# Patient Record
Sex: Female | Born: 1950 | Race: Black or African American | Hispanic: No | Marital: Married | Smoking: Never smoker
Health system: Southern US, Community
[De-identification: ages and names within clinical notes are randomized; demographics above are authoritative.]

## PROBLEM LIST (undated history)

## (undated) DIAGNOSIS — J45909 Unspecified asthma, uncomplicated: Secondary | ICD-10-CM

## (undated) DIAGNOSIS — I499 Cardiac arrhythmia, unspecified: Secondary | ICD-10-CM

## (undated) DIAGNOSIS — I10 Essential (primary) hypertension: Secondary | ICD-10-CM

## (undated) DIAGNOSIS — K219 Gastro-esophageal reflux disease without esophagitis: Secondary | ICD-10-CM

## (undated) DIAGNOSIS — E119 Type 2 diabetes mellitus without complications: Secondary | ICD-10-CM

## (undated) DIAGNOSIS — I639 Cerebral infarction, unspecified: Secondary | ICD-10-CM

## (undated) DIAGNOSIS — M1990 Unspecified osteoarthritis, unspecified site: Secondary | ICD-10-CM

## (undated) DIAGNOSIS — I671 Cerebral aneurysm, nonruptured: Secondary | ICD-10-CM

## (undated) HISTORY — PX: ABDOMINAL HYSTERECTOMY: SHX81

## (undated) HISTORY — PX: RETINAL LASER PROCEDURE: SHX2339

## (undated) HISTORY — PX: CATARACT EXTRACTION W/ INTRAOCULAR LENS  IMPLANT, BILATERAL: SHX1307

---

## 1997-04-14 ENCOUNTER — Encounter: Admission: RE | Admit: 1997-04-14 | Discharge: 1997-07-13 | Payer: Self-pay | Admitting: *Deleted

## 1997-08-24 ENCOUNTER — Emergency Department (HOSPITAL_COMMUNITY): Admission: EM | Admit: 1997-08-24 | Discharge: 1997-08-24 | Payer: Self-pay | Admitting: Emergency Medicine

## 2013-05-11 ENCOUNTER — Other Ambulatory Visit: Payer: Self-pay | Admitting: Orthopedic Surgery

## 2013-05-21 ENCOUNTER — Encounter (HOSPITAL_COMMUNITY): Payer: Self-pay | Admitting: Pharmacy Technician

## 2013-05-22 NOTE — Pre-Procedure Instructions (Signed)
Anne ChapmanCora C Cox  05/22/2013   Your procedure is scheduled on:  May 18  [facility] at 08:00 AM.  Call this number if you have problems the morning of surgery: [phone]   Remember:   Do not eat food or drink liquids after midnight.   Take these medicines the morning of surgery with A SIP OF WATER: Amlodipine, Nexium, Metoprolol, Nasonex, Zyrtec   STOP Aspirin, Multiple Vitamins, Fish Oil today   STOP/ Do not take Aspirin, Aleve, Naproxen, Advil, Ibuprofen, Vitamin, Herbs, or Supplements starting today    Do not wear jewelry, make-up or nail polish.  Do not wear lotions, powders, or perfumes. You may wear deodorant.  Do not shave 48 hours prior to surgery. Men may shave face and neck.  Do not bring valuables to the hospital.  [facility] is not responsible for any belongings or valuables.               Contacts, dentures or bridgework may not be worn into surgery.  Leave suitcase in the car. After surgery it may be brought to your room.  For patients admitted to the hospital, discharge time is determined by your treatment team.               Special Instructions: See [facility] Preparing For [facility]   Please read over the following fact sheets that you were given: Pain Booklet, Coughing and Deep Breathing, Blood Transfusion Information, Total Joint Packet and Surgical Site Infection Prevention

## 2013-05-22 NOTE — Pre-Procedure Instructions (Signed)
[place] - Preparing for Surgery  Before surgery, you can play an important role.  Because skin is not sterile, your skin needs to be as free of germs as possible.  You can reduce the number of germs on you skin by washing with CHG (chlorahexidine gluconate) soap before surgery.  CHG is an antiseptic cleaner which kills germs and bonds with the skin to continue killing germs even after washing.  Please DO NOT use if you have an allergy to CHG or antibacterial soaps.  If your skin becomes reddened/irritated stop using the CHG and inform your nurse when you arrive at Short Stay.  Do not shave (including legs and underarms) for at least 48 hours prior to the first CHG shower.  You may shave your face.  Please follow these instructions carefully:   1.  Shower with CHG Soap the night before surgery and the morning of Surgery.  2.  If you choose to wash your hair, wash your hair first as usual with your normal shampoo.  3.  After you shampoo, rinse your hair and body thoroughly to remove the shampoo.  4.  Use CHG as you would any other liquid soap.  You can apply CHG directly to the skin and wash gently with scrungie or a clean washcloth.  5.  Apply the CHG Soap to your body ONLY FROM THE NECK DOWN.  Do not use on open wounds or open sores.  Avoid contact with your eyes, ears, mouth and genitals (private parts).  Wash genitals (private parts) with your normal soap.  6.  Wash thoroughly, paying special attention to the area where your surgery will be performed.  7.  Thoroughly rinse your body with warm water from the neck down.  8.  DO NOT shower/wash with your normal soap after using and rinsing off the CHG Soap.  9.  Pat yourself dry with a clean towel.            10.  Wear clean pajamas.            11.  Place clean sheets on your bed the night of your first shower and do not sleep with pets.  Day of Surgery  Do not apply any lotions the morning of surgery.  Please wear clean clothes to the  hospital/surgery center.   

## 2013-05-24 ENCOUNTER — Encounter (HOSPITAL_COMMUNITY)
Admission: RE | Admit: 2013-05-24 | Discharge: 2013-05-24 | Disposition: A | Discharge status: Home / Self Care | Payer: BC Managed Care – PPO | Source: Ambulatory Visit | Attending: Orthopedic Surgery | Admitting: Orthopedic Surgery

## 2013-05-24 ENCOUNTER — Ambulatory Visit (HOSPITAL_COMMUNITY)
Admission: RE | Admit: 2013-05-24 | Discharge: 2013-05-24 | Disposition: A | Discharge status: Home / Self Care | Payer: BC Managed Care – PPO | Source: Ambulatory Visit | Attending: Orthopedic Surgery | Admitting: Orthopedic Surgery

## 2013-05-24 ENCOUNTER — Encounter (HOSPITAL_COMMUNITY): Payer: Self-pay

## 2013-05-24 DIAGNOSIS — Z01818 Encounter for other preprocedural examination: Secondary | ICD-10-CM | POA: Insufficient documentation

## 2013-05-24 HISTORY — DX: Unspecified asthma, uncomplicated: J45.909

## 2013-05-24 HISTORY — DX: Unspecified osteoarthritis, unspecified site: M19.90

## 2013-05-24 HISTORY — DX: Cardiac arrhythmia, unspecified: I49.9

## 2013-05-24 HISTORY — DX: Essential (primary) hypertension: I10

## 2013-05-24 HISTORY — DX: Type 2 diabetes mellitus without complications: E11.9

## 2013-05-24 HISTORY — DX: Gastro-esophageal reflux disease without esophagitis: K21.9

## 2013-05-24 HISTORY — DX: Cerebral aneurysm, nonruptured: I67.1

## 2013-05-24 HISTORY — DX: Cerebral infarction, unspecified: I63.9

## 2013-05-24 LAB — CBC WITH DIFFERENTIAL/PLATELET
Basophils Absolute: 0 10*3/uL (ref 0.0–0.1)
Basophils Relative: 1 % (ref 0–1)
EOS PCT: 2 % (ref 0–5)
Eosinophils Absolute: 0.1 10*3/uL (ref 0.0–0.7)
HEMATOCRIT: 40.8 % (ref 36.0–46.0)
HEMOGLOBIN: 13 g/dL (ref 12.0–15.0)
LYMPHS ABS: 1.6 10*3/uL (ref 0.7–4.0)
Lymphocytes Relative: 27 % (ref 12–46)
MCH: 26.3 pg (ref 26.0–34.0)
MCHC: 31.9 g/dL (ref 30.0–36.0)
MCV: 82.4 fL (ref 78.0–100.0)
MONOS PCT: 5 % (ref 3–12)
Monocytes Absolute: 0.3 10*3/uL (ref 0.1–1.0)
Neutro Abs: 3.9 10*3/uL (ref 1.7–7.7)
Neutrophils Relative %: 65 % (ref 43–77)
Platelets: 257 10*3/uL (ref 150–400)
RBC: 4.95 MIL/uL (ref 3.87–5.11)
RDW: 14.9 % (ref 11.5–15.5)
WBC: 6 10*3/uL (ref 4.0–10.5)

## 2013-05-24 LAB — TYPE AND SCREEN
ABO/RH(D): A POS
ANTIBODY SCREEN: NEGATIVE

## 2013-05-24 LAB — URINALYSIS, ROUTINE W REFLEX MICROSCOPIC
Bilirubin Urine: NEGATIVE
Hgb urine dipstick: NEGATIVE
KETONES UR: NEGATIVE mg/dL
Nitrite: NEGATIVE
Protein, ur: NEGATIVE mg/dL
Specific Gravity, Urine: 1.04 — ABNORMAL HIGH (ref 1.005–1.030)
Urobilinogen, UA: 0.2 mg/dL (ref 0.0–1.0)
pH: 5 (ref 5.0–8.0)

## 2013-05-24 LAB — SURGICAL PCR SCREEN
MRSA, PCR: NEGATIVE
Staphylococcus aureus: NEGATIVE

## 2013-05-24 LAB — COMPREHENSIVE METABOLIC PANEL
ALT: 23 U/L (ref 0–35)
AST: 17 U/L (ref 0–37)
Albumin: 3.6 g/dL (ref 3.5–5.2)
Alkaline Phosphatase: 93 U/L (ref 39–117)
BUN: 12 mg/dL (ref 6–23)
CALCIUM: 9.2 mg/dL (ref 8.4–10.5)
CO2: 29 meq/L (ref 19–32)
Chloride: 101 mEq/L (ref 96–112)
Creatinine, Ser: 0.67 mg/dL (ref 0.50–1.10)
GFR calc non Af Amer: >90 mL/min (ref >90)
GLUCOSE: 195 mg/dL — AB (ref 70–99)
Potassium: 3.4 mEq/L — ABNORMAL LOW (ref 3.7–5.3)
SODIUM: 142 meq/L (ref 137–147)
Total Bilirubin: 0.4 mg/dL (ref 0.3–1.2)
Total Protein: 7.2 g/dL (ref 6.0–8.3)

## 2013-05-24 LAB — PROTIME-INR
INR: 0.91 (ref 0.00–1.49)
PROTHROMBIN TIME: 12.1 s (ref 11.6–15.2)

## 2013-05-24 LAB — APTT: aPTT: 46 seconds — ABNORMAL HIGH (ref 24–37)

## 2013-05-24 LAB — URINE MICROSCOPIC-ADD ON

## 2013-05-24 LAB — ABO/RH: ABO/RH(D): A POS

## 2013-05-24 NOTE — Progress Notes (Signed)
REQUESTED STRESS TEST, ECHO  FROM MEDICAL MD [phone].

## 2013-05-25 LAB — URINE CULTURE: Colony Count: >=100000

## 2013-05-25 NOTE — Progress Notes (Signed)
Anesthesia chart review: Patient is a 63 year old female scheduled for left TKA on 05/31/13 by Dr. Sherlean FootLucey.   History includes nonsmoker, hypertension, "irregular heartbeat" without mention of atrial fibrillation, diabetes mellitus type 2, asthma, GERD, arthritis, cerebral aneurysm with history of CVA '80's, hysterectomy. BMI is 37.9 consistent with obesity. PCP is Dr. Donata DuffMichael McLeod who cleared her for surgery following a non-ischemic stress test.  EKG on 05/24/13 showed NSR, possible LAE, LVH.   Nuclear stress test on 05/10/13 showed: No reversible perfusion defects on Lexis scan stress study. Calculated EF 65%.  Chest x-ray on 05/24/13 Center active cardiopulmonary disease.  Preoperative labs noted. Cr 0.67, glucose 195. CBC, PT/INR WNL. PTT elevated at 46. Urine culture showed greater than 100,000 colonies but multiple bacterial morphotypes present, none predominant. A1C was 7.5 in 02/2013 at Dr. Madalyn RobMcLeod's office. I'll repeat her PTT on arrival since it was elevated above 40.  If follow-up labs acceptable and otherwise no acute changes then I would anticipate that she can proceed as planned.  Velna Ochsllison Zelenak, PA-C [facility]/Anesthesiology Phone [phone] 05/25/2013 12:58 PM

## 2013-05-30 MED ORDER — CEFAZOLIN SODIUM-DEXTROSE 2-3 GM-% IV SOLR: 2.0000 g | INTRAVENOUS | Status: DC

## 2013-05-30 MED ORDER — BUPIVACAINE LIPOSOME 1.3 % IJ SUSP
20.0000 mL | Freq: Once | INTRAMUSCULAR | Status: DC
  Filled 2013-05-30: qty 20

## 2013-05-30 MED ORDER — TRANEXAMIC ACID 100 MG/ML IV SOLN
1000.0000 mg | INTRAVENOUS | Status: AC
  Administered 2013-05-31: 1000 mg via INTRAVENOUS
  Filled 2013-05-30: qty 10

## 2013-05-30 MED ORDER — CHLORHEXIDINE GLUCONATE 4 % EX LIQD
60.0000 mL | Freq: Once | CUTANEOUS | Status: DC
  Filled 2013-05-30: qty 60

## 2013-05-31 ENCOUNTER — Encounter (HOSPITAL_COMMUNITY)
Admission: RE | Disposition: A | Discharge status: Home / Self Care | Payer: Self-pay | Source: Ambulatory Visit | Attending: Orthopedic Surgery

## 2013-05-31 ENCOUNTER — Encounter (HOSPITAL_COMMUNITY): Payer: BC Managed Care – PPO | Admitting: Vascular Surgery

## 2013-05-31 ENCOUNTER — Inpatient Hospital Stay (HOSPITAL_COMMUNITY): Payer: BC Managed Care – PPO | Admitting: Anesthesiology

## 2013-05-31 ENCOUNTER — Inpatient Hospital Stay (HOSPITAL_COMMUNITY)
Admission: RE | Admit: 2013-05-31 | Discharge: 2013-06-02 | DRG: 470 | Disposition: A | Discharge status: Home / Self Care | Payer: BC Managed Care – PPO | Source: Ambulatory Visit | Attending: Orthopedic Surgery | Admitting: Orthopedic Surgery

## 2013-05-31 ENCOUNTER — Encounter (HOSPITAL_COMMUNITY): Payer: Self-pay

## 2013-05-31 DIAGNOSIS — J45909 Unspecified asthma, uncomplicated: Secondary | ICD-10-CM | POA: Diagnosis present

## 2013-05-31 DIAGNOSIS — E119 Type 2 diabetes mellitus without complications: Secondary | ICD-10-CM | POA: Diagnosis present

## 2013-05-31 DIAGNOSIS — M25569 Pain in unspecified knee: Secondary | ICD-10-CM | POA: Diagnosis present

## 2013-05-31 DIAGNOSIS — D62 Acute posthemorrhagic anemia: Secondary | ICD-10-CM | POA: Diagnosis not present

## 2013-05-31 DIAGNOSIS — Z96659 Presence of unspecified artificial knee joint: Secondary | ICD-10-CM

## 2013-05-31 DIAGNOSIS — I10 Essential (primary) hypertension: Secondary | ICD-10-CM | POA: Diagnosis present

## 2013-05-31 DIAGNOSIS — K219 Gastro-esophageal reflux disease without esophagitis: Secondary | ICD-10-CM | POA: Diagnosis present

## 2013-05-31 DIAGNOSIS — Z8673 Personal history of transient ischemic attack (TIA), and cerebral infarction without residual deficits: Secondary | ICD-10-CM

## 2013-05-31 DIAGNOSIS — M1710 Unilateral primary osteoarthritis, unspecified knee: Principal | ICD-10-CM | POA: Diagnosis present

## 2013-05-31 HISTORY — PX: TOTAL KNEE ARTHROPLASTY: SHX125

## 2013-05-31 LAB — CBC
HCT: 37.9 % (ref 36.0–46.0)
Hemoglobin: 12.2 g/dL (ref 12.0–15.0)
MCH: 26.5 pg (ref 26.0–34.0)
MCHC: 32.2 g/dL (ref 30.0–36.0)
MCV: 82.4 fL (ref 78.0–100.0)
PLATELETS: 198 10*3/uL (ref 150–400)
RBC: 4.6 MIL/uL (ref 3.87–5.11)
RDW: 15 % (ref 11.5–15.5)
WBC: 12.9 10*3/uL — AB (ref 4.0–10.5)

## 2013-05-31 LAB — HEMOGLOBIN A1C
Hgb A1c MFr Bld: 8.4 % — ABNORMAL HIGH (ref <5.7)
Mean Plasma Glucose: 194 mg/dL — ABNORMAL HIGH (ref <117)

## 2013-05-31 LAB — CREATININE, SERUM
Creatinine, Ser: 0.51 mg/dL (ref 0.50–1.10)
GFR calc non Af Amer: >90 mL/min (ref >90)

## 2013-05-31 LAB — GLUCOSE, CAPILLARY
GLUCOSE-CAPILLARY: 173 mg/dL — AB (ref 70–99)
Glucose-Capillary: 125 mg/dL — ABNORMAL HIGH (ref 70–99)
Glucose-Capillary: 215 mg/dL — ABNORMAL HIGH (ref 70–99)

## 2013-05-31 LAB — APTT: APTT: 42 s — AB (ref 24–37)

## 2013-05-31 SURGERY — ARTHROPLASTY, KNEE, TOTAL
Anesthesia: Regional | Laterality: Left

## 2013-05-31 MED ORDER — CANAGLIFLOZIN 300 MG PO TABS
300.0000 mg | ORAL_TABLET | Freq: Every day | ORAL | Status: DC
  Administered 2013-06-01 – 2013-06-02 (×2): 300 mg via ORAL
  Filled 2013-05-31 (×2): qty 1

## 2013-05-31 MED ORDER — FENTANYL CITRATE 0.05 MG/ML IJ SOLN
INTRAMUSCULAR | Status: DC | PRN
Start: 2013-05-31 — End: 2013-05-31
  Administered 2013-05-31: 50 ug via INTRAVENOUS
  Administered 2013-05-31 (×2): 100 ug via INTRAVENOUS
  Administered 2013-05-31: 50 ug via INTRAVENOUS

## 2013-05-31 MED ORDER — LACTATED RINGERS IV SOLN
INTRAVENOUS | Status: DC
  Administered 2013-05-31 (×2): via INTRAVENOUS

## 2013-05-31 MED ORDER — INSULIN DETEMIR 100 UNIT/ML FLEXPEN: 55.0000 [IU] | PEN_INJECTOR | Freq: Two times a day (BID) | SUBCUTANEOUS | Status: DC

## 2013-05-31 MED ORDER — SODIUM CHLORIDE 0.9 % IV SOLN: INTRAVENOUS | Status: DC

## 2013-05-31 MED ORDER — DOCUSATE SODIUM 100 MG PO CAPS
100.0000 mg | ORAL_CAPSULE | Freq: Two times a day (BID) | ORAL | Status: DC
  Administered 2013-05-31 – 2013-06-02 (×4): 100 mg via ORAL
  Filled 2013-05-31 (×5): qty 1

## 2013-05-31 MED ORDER — SUCCINYLCHOLINE CHLORIDE 20 MG/ML IJ SOLN
INTRAMUSCULAR | Status: DC | PRN
  Administered 2013-05-31: 120 mg via INTRAVENOUS

## 2013-05-31 MED ORDER — LORATADINE 10 MG PO TABS
10.0000 mg | ORAL_TABLET | Freq: Every day | ORAL | Status: DC
  Administered 2013-06-01 – 2013-06-02 (×2): 10 mg via ORAL
  Filled 2013-05-31 (×2): qty 1

## 2013-05-31 MED ORDER — PHENYLEPHRINE HCL 10 MG/ML IJ SOLN
INTRAMUSCULAR | Status: DC | PRN
  Administered 2013-05-31: 80 ug via INTRAVENOUS

## 2013-05-31 MED ORDER — FLEET ENEMA 7-19 GM/118ML RE ENEM: 1.0000 | ENEMA | Freq: Once | RECTAL | Status: AC | PRN

## 2013-05-31 MED ORDER — METOPROLOL SUCCINATE ER 50 MG PO TB24
50.0000 mg | ORAL_TABLET | Freq: Two times a day (BID) | ORAL | Status: DC
  Administered 2013-05-31 – 2013-06-02 (×4): 50 mg via ORAL
  Filled 2013-05-31 (×5): qty 1

## 2013-05-31 MED ORDER — SODIUM CHLORIDE 0.9 % IR SOLN
Status: DC | PRN
  Administered 2013-05-31: 2000 mL

## 2013-05-31 MED ORDER — HYDROMORPHONE HCL PF 1 MG/ML IJ SOLN: 1.0000 mg | INTRAMUSCULAR | Status: DC | PRN

## 2013-05-31 MED ORDER — LOSARTAN POTASSIUM-HCTZ 100-25 MG PO TABS: 1.0000 | ORAL_TABLET | Freq: Every day | ORAL | Status: DC

## 2013-05-31 MED ORDER — ATORVASTATIN CALCIUM 20 MG PO TABS
20.0000 mg | ORAL_TABLET | Freq: Every day | ORAL | Status: DC
  Administered 2013-05-31 – 2013-06-01 (×2): 20 mg via ORAL
  Filled 2013-05-31 (×3): qty 1

## 2013-05-31 MED ORDER — OXYCODONE HCL 5 MG PO TABS
5.0000 mg | ORAL_TABLET | ORAL | Status: DC | PRN
  Administered 2013-05-31 – 2013-06-02 (×5): 10 mg via ORAL
  Filled 2013-05-31 (×5): qty 2

## 2013-05-31 MED ORDER — FENTANYL CITRATE 0.05 MG/ML IJ SOLN
INTRAMUSCULAR | Status: AC
  Administered 2013-05-31: 100 ug
  Filled 2013-05-31: qty 2

## 2013-05-31 MED ORDER — ONDANSETRON HCL 4 MG/2ML IJ SOLN: 4.0000 mg | Freq: Four times a day (QID) | INTRAMUSCULAR | Status: DC | PRN

## 2013-05-31 MED ORDER — INSULIN DETEMIR 100 UNIT/ML ~~LOC~~ SOLN
55.0000 [IU] | Freq: Two times a day (BID) | SUBCUTANEOUS | Status: DC
  Administered 2013-05-31 – 2013-06-01 (×3): 55 [IU] via SUBCUTANEOUS
  Filled 2013-05-31 (×5): qty 0.55

## 2013-05-31 MED ORDER — METHOCARBAMOL 500 MG PO TABS
500.0000 mg | ORAL_TABLET | Freq: Four times a day (QID) | ORAL | Status: DC | PRN
  Administered 2013-06-01 – 2013-06-02 (×5): 500 mg via ORAL
  Filled 2013-05-31 (×5): qty 1

## 2013-05-31 MED ORDER — TAVABOROLE 5 % EX SOLN: 1.0000 "application " | Freq: Every day | CUTANEOUS | Status: DC

## 2013-05-31 MED ORDER — LOSARTAN POTASSIUM 50 MG PO TABS
100.0000 mg | ORAL_TABLET | Freq: Every day | ORAL | Status: DC
  Administered 2013-05-31 – 2013-06-02 (×3): 100 mg via ORAL
  Filled 2013-05-31 (×3): qty 2

## 2013-05-31 MED ORDER — LIDOCAINE HCL (CARDIAC) 10 MG/ML IV SOLN
INTRAVENOUS | Status: DC | PRN
  Administered 2013-05-31: 80 mg via INTRAVENOUS

## 2013-05-31 MED ORDER — BUPIVACAINE-EPINEPHRINE (PF) 0.5% -1:200000 IJ SOLN
INTRAMUSCULAR | Status: AC
  Filled 2013-05-31: qty 30

## 2013-05-31 MED ORDER — PROPOFOL 10 MG/ML IV BOLUS
INTRAVENOUS | Status: DC | PRN
  Administered 2013-05-31: 180 mg via INTRAVENOUS

## 2013-05-31 MED ORDER — WHITE PETROLATUM GEL
Status: AC
  Administered 2013-05-31: 0.2
  Filled 2013-05-31: qty 5

## 2013-05-31 MED ORDER — DIPHENHYDRAMINE HCL 12.5 MG/5ML PO ELIX: 12.5000 mg | ORAL_SOLUTION | ORAL | Status: DC | PRN

## 2013-05-31 MED ORDER — MIDAZOLAM HCL 2 MG/2ML IJ SOLN
INTRAMUSCULAR | Status: AC
  Administered 2013-05-31: 2 mg
  Filled 2013-05-31: qty 2

## 2013-05-31 MED ORDER — METHOCARBAMOL 1000 MG/10ML IJ SOLN
500.0000 mg | Freq: Four times a day (QID) | INTRAVENOUS | Status: DC | PRN
  Filled 2013-05-31: qty 5

## 2013-05-31 MED ORDER — BUPIVACAINE LIPOSOME 1.3 % IJ SUSP
INTRAMUSCULAR | Status: DC | PRN
  Administered 2013-05-31: 20 mL

## 2013-05-31 MED ORDER — SODIUM CHLORIDE 0.9 % IR SOLN
Status: DC | PRN
  Administered 2013-05-31: 1000 mL

## 2013-05-31 MED ORDER — PANTOPRAZOLE SODIUM 40 MG PO TBEC
80.0000 mg | DELAYED_RELEASE_TABLET | Freq: Every day | ORAL | Status: DC
  Administered 2013-06-02: 80 mg via ORAL
  Filled 2013-05-31: qty 2

## 2013-05-31 MED ORDER — ACETAMINOPHEN 325 MG PO TABS: 650.0000 mg | ORAL_TABLET | Freq: Four times a day (QID) | ORAL | Status: DC | PRN

## 2013-05-31 MED ORDER — HYDROMORPHONE HCL PF 1 MG/ML IJ SOLN: 0.2500 mg | INTRAMUSCULAR | Status: DC | PRN

## 2013-05-31 MED ORDER — MENTHOL 3 MG MT LOZG: 1.0000 | LOZENGE | OROMUCOSAL | Status: DC | PRN

## 2013-05-31 MED ORDER — ALUM & MAG HYDROXIDE-SIMETH 200-200-20 MG/5ML PO SUSP: 30.0000 mL | ORAL | Status: DC | PRN

## 2013-05-31 MED ORDER — HYDROCHLOROTHIAZIDE 25 MG PO TABS
25.0000 mg | ORAL_TABLET | Freq: Every day | ORAL | Status: DC
  Administered 2013-05-31 – 2013-06-02 (×3): 25 mg via ORAL
  Filled 2013-05-31 (×3): qty 1

## 2013-05-31 MED ORDER — CEFAZOLIN SODIUM 1-5 GM-% IV SOLN
1.0000 g | Freq: Three times a day (TID) | INTRAVENOUS | Status: DC
  Filled 2013-05-31: qty 50

## 2013-05-31 MED ORDER — PHENOL 1.4 % MT LIQD: 1.0000 | OROMUCOSAL | Status: DC | PRN

## 2013-05-31 MED ORDER — INSULIN ASPART 100 UNIT/ML ~~LOC~~ SOLN
0.0000 [IU] | Freq: Three times a day (TID) | SUBCUTANEOUS | Status: DC
  Administered 2013-05-31 – 2013-06-01 (×3): 3 [IU] via SUBCUTANEOUS
  Administered 2013-06-01: 2 [IU] via SUBCUTANEOUS

## 2013-05-31 MED ORDER — ENOXAPARIN SODIUM 30 MG/0.3ML ~~LOC~~ SOLN
30.0000 mg | Freq: Two times a day (BID) | SUBCUTANEOUS | Status: DC
  Administered 2013-06-01 – 2013-06-02 (×3): 30 mg via SUBCUTANEOUS
  Filled 2013-05-31 (×7): qty 0.3

## 2013-05-31 MED ORDER — PHENYLEPHRINE HCL 10 MG/ML IJ SOLN
10.0000 mg | INTRAVENOUS | Status: DC | PRN
  Administered 2013-05-31: 30 ug/min via INTRAVENOUS

## 2013-05-31 MED ORDER — AMLODIPINE BESYLATE 10 MG PO TABS
10.0000 mg | ORAL_TABLET | Freq: Every day | ORAL | Status: DC
  Administered 2013-06-01 – 2013-06-02 (×2): 10 mg via ORAL
  Filled 2013-05-31 (×2): qty 1

## 2013-05-31 MED ORDER — CEFAZOLIN SODIUM-DEXTROSE 2-3 GM-% IV SOLR
INTRAVENOUS | Status: AC
  Administered 2013-05-31: 3 g via INTRAVENOUS
  Filled 2013-05-31: qty 50

## 2013-05-31 MED ORDER — CELECOXIB 200 MG PO CAPS
200.0000 mg | ORAL_CAPSULE | Freq: Two times a day (BID) | ORAL | Status: DC
  Administered 2013-05-31 – 2013-06-02 (×4): 200 mg via ORAL
  Filled 2013-05-31 (×5): qty 1

## 2013-05-31 MED ORDER — METOCLOPRAMIDE HCL 5 MG/ML IJ SOLN: 5.0000 mg | Freq: Three times a day (TID) | INTRAMUSCULAR | Status: DC | PRN

## 2013-05-31 MED ORDER — BUPIVACAINE-EPINEPHRINE 0.5% -1:200000 IJ SOLN
INTRAMUSCULAR | Status: DC | PRN
  Administered 2013-05-31: 30 mL

## 2013-05-31 MED ORDER — OXYCODONE HCL 5 MG PO TABS: 5.0000 mg | ORAL_TABLET | Freq: Once | ORAL | Status: DC | PRN

## 2013-05-31 MED ORDER — SENNOSIDES-DOCUSATE SODIUM 8.6-50 MG PO TABS: 1.0000 | ORAL_TABLET | Freq: Every evening | ORAL | Status: DC | PRN

## 2013-05-31 MED ORDER — ACETAMINOPHEN 650 MG RE SUPP: 650.0000 mg | Freq: Four times a day (QID) | RECTAL | Status: DC | PRN

## 2013-05-31 MED ORDER — METOCLOPRAMIDE HCL 10 MG PO TABS: 5.0000 mg | ORAL_TABLET | Freq: Three times a day (TID) | ORAL | Status: DC | PRN

## 2013-05-31 MED ORDER — TERBINAFINE HCL 250 MG PO TABS
250.0000 mg | ORAL_TABLET | Freq: Every day | ORAL | Status: DC
  Administered 2013-05-31 – 2013-06-02 (×3): 250 mg via ORAL
  Filled 2013-05-31 (×3): qty 1

## 2013-05-31 MED ORDER — OXYCODONE HCL ER 10 MG PO T12A
10.0000 mg | EXTENDED_RELEASE_TABLET | Freq: Two times a day (BID) | ORAL | Status: DC
  Administered 2013-05-31 – 2013-06-02 (×3): 10 mg via ORAL
  Filled 2013-05-31 (×3): qty 1

## 2013-05-31 MED ORDER — CEFAZOLIN SODIUM-DEXTROSE 2-3 GM-% IV SOLR
2.0000 g | Freq: Four times a day (QID) | INTRAVENOUS | Status: AC
  Administered 2013-05-31 (×2): 2 g via INTRAVENOUS
  Filled 2013-05-31 (×2): qty 50

## 2013-05-31 MED ORDER — ONDANSETRON HCL 4 MG/2ML IJ SOLN
INTRAMUSCULAR | Status: DC | PRN
  Administered 2013-05-31: 4 mg via INTRAVENOUS

## 2013-05-31 MED ORDER — LIRAGLUTIDE 18 MG/3ML ~~LOC~~ SOPN
1.8000 [IU] | PEN_INJECTOR | Freq: Every morning | SUBCUTANEOUS | Status: DC
  Administered 2013-06-02: 1.8 [IU] via SUBCUTANEOUS

## 2013-05-31 MED ORDER — BISACODYL 5 MG PO TBEC: 5.0000 mg | DELAYED_RELEASE_TABLET | Freq: Every day | ORAL | Status: DC | PRN

## 2013-05-31 MED ORDER — SODIUM CHLORIDE 0.9 % IV SOLN
INTRAVENOUS | Status: DC
  Administered 2013-05-31: 75 mL/h via INTRAVENOUS
  Administered 2013-06-01: 06:00:00 via INTRAVENOUS

## 2013-05-31 MED ORDER — OXYCODONE HCL 5 MG/5ML PO SOLN: 5.0000 mg | Freq: Once | ORAL | Status: DC | PRN

## 2013-05-31 MED ORDER — ONDANSETRON HCL 4 MG PO TABS: 4.0000 mg | ORAL_TABLET | Freq: Four times a day (QID) | ORAL | Status: DC | PRN

## 2013-05-31 MED ORDER — BUPIVACAINE-EPINEPHRINE (PF) 0.5% -1:200000 IJ SOLN
INTRAMUSCULAR | Status: DC | PRN
  Administered 2013-05-31: 20 mL

## 2013-05-31 SURGICAL SUPPLY — 58 items
BANDAGE ELASTIC 6 VELCRO ST LF (GAUZE/BANDAGES/DRESSINGS) ×1 IMPLANT
BANDAGE ESMARK 6X9 LF (GAUZE/BANDAGES/DRESSINGS) ×1 IMPLANT
BLADE 10 SAFETY STRL DISP (BLADE) ×2 IMPLANT
BLADE SAGITTAL 13X1.27X60 (BLADE) ×2 IMPLANT
BLADE SAW SGTL 83.5X18.5 (BLADE) ×2 IMPLANT
BNDG CMPR 9X6 STRL LF SNTH (GAUZE/BANDAGES/DRESSINGS) ×1
BNDG ESMARK 6X9 LF (GAUZE/BANDAGES/DRESSINGS) ×2
BOWL SMART MIX CTS (DISPOSABLE) ×2 IMPLANT
CAP POR NKTM CP VIT E LN CER ×1 IMPLANT
CEMENT BONE SIMPLEX SPEEDSET (Cement) ×4 IMPLANT
COVER SURGICAL LIGHT HANDLE (MISCELLANEOUS) ×2 IMPLANT
CUFF TOURNIQUET SINGLE 34IN LL (TOURNIQUET CUFF) ×2 IMPLANT
DRAPE EXTREMITY T 121X128X90 (DRAPE) ×2 IMPLANT
DRAPE INCISE IOBAN 66X45 STRL (DRAPES) ×4 IMPLANT
DRAPE PROXIMA HALF (DRAPES) ×2 IMPLANT
DRAPE U-SHAPE 47X51 STRL (DRAPES) ×2 IMPLANT
DRSG ADAPTIC 3X8 NADH LF (GAUZE/BANDAGES/DRESSINGS) ×2 IMPLANT
DRSG PAD ABDOMINAL 8X10 ST (GAUZE/BANDAGES/DRESSINGS) ×2 IMPLANT
DURAPREP 26ML APPLICATOR (WOUND CARE) ×4 IMPLANT
ELECT REM PT RETURN 9FT ADLT (ELECTROSURGICAL) ×2
ELECTRODE REM PT RTRN 9FT ADLT (ELECTROSURGICAL) ×1 IMPLANT
EVACUATOR 1/8 PVC DRAIN (DRAIN) ×2 IMPLANT
GLOVE BIOGEL M 7.0 STRL (GLOVE) IMPLANT
GLOVE BIOGEL PI IND STRL 7.5 (GLOVE) IMPLANT
GLOVE BIOGEL PI IND STRL 8.5 (GLOVE) ×2 IMPLANT
GLOVE BIOGEL PI INDICATOR 7.5 (GLOVE)
GLOVE BIOGEL PI INDICATOR 8.5 (GLOVE) ×2
GLOVE SURG ORTHO 8.0 STRL STRW (GLOVE) ×4 IMPLANT
GOWN STRL REUS W/ TWL LRG LVL3 (GOWN DISPOSABLE) ×2 IMPLANT
GOWN STRL REUS W/ TWL XL LVL3 (GOWN DISPOSABLE) ×2 IMPLANT
GOWN STRL REUS W/TWL LRG LVL3 (GOWN DISPOSABLE) ×4
GOWN STRL REUS W/TWL XL LVL3 (GOWN DISPOSABLE) ×4
HANDPIECE INTERPULSE COAX TIP (DISPOSABLE) ×2
HOOD PEEL AWAY FACE SHEILD DIS (HOOD) ×8 IMPLANT
KIT BASIN OR (CUSTOM PROCEDURE TRAY) ×2 IMPLANT
KIT ROOM TURNOVER OR (KITS) ×2 IMPLANT
MANIFOLD NEPTUNE II (INSTRUMENTS) ×2 IMPLANT
NEEDLE 22X1 1/2 (OR ONLY) (NEEDLE) ×4 IMPLANT
NS IRRIG 1000ML POUR BTL (IV SOLUTION) ×2 IMPLANT
PACK TOTAL JOINT (CUSTOM PROCEDURE TRAY) ×2 IMPLANT
PAD ARMBOARD 7.5X6 YLW CONV (MISCELLANEOUS) ×4 IMPLANT
PADDING CAST COTTON 6X4 STRL (CAST SUPPLIES) ×2 IMPLANT
SET HNDPC FAN SPRY TIP SCT (DISPOSABLE) ×1 IMPLANT
SPONGE GAUZE 4X4 12PLY (GAUZE/BANDAGES/DRESSINGS) ×2 IMPLANT
SPONGE GAUZE 4X4 12PLY STER LF (GAUZE/BANDAGES/DRESSINGS) ×1 IMPLANT
STAPLER VISISTAT 35W (STAPLE) ×2 IMPLANT
STOCKINETTE IMPERVIOUS LG (DRAPES) ×1 IMPLANT
SUCTION FRAZIER TIP 10 FR DISP (SUCTIONS) ×2 IMPLANT
SUT BONE WAX W31G (SUTURE) ×2 IMPLANT
SUT VIC AB 0 CTB1 27 (SUTURE) ×4 IMPLANT
SUT VIC AB 1 CT1 27 (SUTURE) ×6
SUT VIC AB 1 CT1 27XBRD ANBCTR (SUTURE) ×2 IMPLANT
SUT VIC AB 2-0 CT1 27 (SUTURE) ×4
SUT VIC AB 2-0 CT1 TAPERPNT 27 (SUTURE) ×2 IMPLANT
SYR CONTROL 10ML LL (SYRINGE) ×4 IMPLANT
TOWEL OR 17X24 6PK STRL BLUE (TOWEL DISPOSABLE) ×2 IMPLANT
TOWEL OR 17X26 10 PK STRL BLUE (TOWEL DISPOSABLE) ×2 IMPLANT
TRAY FOLEY CATH 14FR (SET/KITS/TRAYS/PACK) ×2 IMPLANT

## 2013-05-31 NOTE — Anesthesia Preprocedure Evaluation (Addendum)
Anesthesia Evaluation  Patient identified by MRN, date of birth, ID band Patient awake    Reviewed: Allergy & Precautions, H&P , NPO status , Patient's Chart, lab work & pertinent test results  Airway Mallampati: II TM Distance: <3 FB Neck ROM: full    Dental  (+) Teeth Intact, Dental Advisory Given   Pulmonary asthma ,          Cardiovascular hypertension, + Peripheral Vascular Disease     Neuro/Psych CVA    GI/Hepatic GERD-  ,  Endo/Other  diabetes, Type obesity  Renal/GU      Musculoskeletal  (+) Arthritis -,   Abdominal   Peds  Hematology   Anesthesia Other Findings   Reproductive/Obstetrics                         Anesthesia Physical Anesthesia Plan  ASA: III  Anesthesia Plan: General and Regional   Post-op Pain Management: MAC Combined w/ Regional for Post-op pain   Induction: Intravenous  Airway Management Planned: Oral ETT  Additional Equipment:   Intra-op Plan:   Post-operative Plan: Extubation in OR  Informed Consent: I have reviewed the patients History and Physical, chart, labs and discussed the procedure including the risks, benefits and alternatives for the proposed anesthesia with the patient or authorized representative who has indicated his/her understanding and acceptance.     Plan Discussed with: CRNA, Anesthesiologist and Surgeon  Anesthesia Plan Comments:         Anesthesia Quick Evaluation

## 2013-05-31 NOTE — Anesthesia Postprocedure Evaluation (Signed)
Anesthesia Post Note  Patient: Anne ChapmanCora C Delucia  Procedure(s) Performed: Procedure(s) (LRB): TOTAL KNEE ARTHROPLASTY (Left)  Anesthesia type: General  Patient location: PACU  Post pain: Pain level controlled and Adequate analgesia  Post assessment: Post-op Vital signs reviewed, Patient's Cardiovascular Status Stable, Respiratory Function Stable, Patent Airway and Pain level controlled  Last Vitals:  Filed Vitals:   05/31/13 1245  BP: 174/79  Pulse: 92  Temp:   Resp: 27    Post vital signs: Reviewed and stable  Level of consciousness: awake, alert  and oriented  Complications: No apparent anesthesia complications

## 2013-05-31 NOTE — Progress Notes (Signed)
Physical Therapy Evaluation Patient Details Name: Anne Cox MRN: [id number] DOB: 07/20/1950 Today's Date: 05/31/2013   History of Present Illness  Patient is a 63 yo female admitted 05/31/13 now s/p Lt TKA.  Patient with h/o HTN, DM, and asthma.  Clinical Impression  Patient presents with problems listed below.  Will benefit from acute PT to maximize independence prior to discharge home with husband.    Follow Up Recommendations Home health PT (If patient progresses well with PT);Supervision/Assistance - 24 hour    Equipment Recommendations  None recommended by PT    Recommendations for Other Services       Precautions / Restrictions Precautions Precautions: Knee Precaution Booklet Issued: Yes (comment) Precaution Comments: Reviewed precautions with patient and her husband. Restrictions Weight Bearing Restrictions: Yes LLE Weight Bearing: Weight bearing as tolerated      Mobility  Bed Mobility Overal bed mobility: Needs Assistance Bed Mobility: Supine to Sit     Supine to sit: Min assist     General bed mobility comments: Instructed patient on donning KI on LLE.  Verbal cues for bed mobility.  Assist to move LLE off of bed.  Transfers Overall transfer level: Needs assistance Equipment used: Rolling walker (2 wheeled) Transfers: Sit to/from [employer] CorporationStand;Stand Pivot Transfers Sit to Stand: Mod assist Stand pivot transfers: Min assist       General transfer comment: Verbal cues for hand placement.  Assist to rise to standing and for balance.  Once upright, patient able to take several shuffle steps to pivot to chair.  Assist to control descent into chair.  Ambulation/Gait                Stairs            Wheelchair Mobility    Modified Rankin (Stroke Patients Only)       Balance                                             Pertinent Vitals/Pain     Home Living Family/patient expects to be discharged to:: Private  residence Living Arrangements: Spouse/significant other Available Help at Discharge: Family;Available 24 hours/day Type of Home: House Home Access: Stairs to enter Entrance Stairs-Rails: None Entrance Stairs-Number of Steps: 1 Home Layout: One level Home Equipment: Walker - 2 wheels;Bedside commode      Prior Function Level of Independence: Independent         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     Communication   Communication: No difficulties  Cognition Arousal/Alertness: Awake/alert Behavior During Therapy: Anxious Overall Cognitive Status: Within Functional Limits for tasks assessed                      General Comments      Exercises Total Joint Exercises Ankle Circles/Pumps: AROM;Both;10 reps;Seated Quad Sets: AROM;Left;5 reps;Seated      Assessment/Plan    PT Assessment Patient needs continued PT services  PT Diagnosis Difficulty walking;Acute pain   PT Problem List Decreased strength;Decreased range of motion;Decreased activity tolerance;Decreased balance;Decreased mobility;Decreased knowledge of use of DME;Decreased  knowledge of precautions;Pain  PT Treatment Interventions DME instruction;Gait training;Stair training;Functional mobility training;Therapeutic exercise;Patient/family education   PT Goals (Current goals can be found in the Care Plan section) Acute Rehab PT Goals Patient Stated Goal: To get stronger before I go home. PT Goal Formulation: With patient/family Time For Goal Achievement: 06/07/13 Potential to Achieve Goals: Good    Frequency 7X/week   Barriers to discharge        Co-evaluation               End of Session Equipment Utilized During Treatment: Gait belt Activity  Tolerance: Patient limited by pain;Patient limited by fatigue Patient left: in chair;with call bell/phone within reach;with family/visitor present Nurse Communication: Mobility status (Patient in chair)         Time: [id number] PT Time Calculation (min): 18 min   Charges:   PT Evaluation $Initial PT Evaluation Tier I: 1 Procedure PT Treatments $Therapeutic Activity: 8-22 mins   PT G Codes:          Vena AustriaSusan H Tynika Luddy 05/31/2013, 8:17 PM Durenda HurtSusan H. Renaldo Fiddleravis, PT, [facility] Acute Rehab Services Pager [phone]

## 2013-05-31 NOTE — H&P (Signed)
  Anne ChapmanCora C Pancake MRN:  [id number] DOB/SEX:  06/07/1950/female  CHIEF COMPLAINT:  Painful left Knee  HISTORY: Patient is a 63 y.o. female presented with a history of pain in the left knee. Onset of symptoms was gradual starting several years ago with gradually worsening course since that time. Prior procedures on the knee include none. Patient has been treated conservatively with over-the-counter NSAIDs and activity modification. Patient currently rates pain in the knee at 10 out of 10 with activity. There is pain at night.  PAST MEDICAL HISTORY: There are no active problems to display for this patient.  Past Medical History  Diagnosis Date  . Hypertension   . Dysrhythmia     IRREG HEARTBEAT  . Stroke     1980  . Cerebral aneurysm     1984   RESOLVED  . Asthma   . Diabetes mellitus without complication   . GERD (gastroesophageal reflux disease)   . Arthritis    Past Surgical History  Procedure Laterality Date  . Abdominal hysterectomy    . Cataract extraction w/ intraocular lens  implant, bilateral    . Cesarean section    . Retinal laser procedure      BILAT     MEDICATIONS:   No prescriptions prior to admission    ALLERGIES:  No Known Allergies  REVIEW OF SYSTEMS:  Pertinent items are noted in HPI.   FAMILY HISTORY:  No family history on file.  SOCIAL HISTORY:   History  Substance Use Topics  . Smoking status: Never Smoker   . Smokeless tobacco: Not on file  . Alcohol Use: No     EXAMINATION:  Vital signs in last 24 hours:    General appearance: alert, cooperative and no distress Lungs: clear to auscultation bilaterally Heart: regular rate and rhythm, S1, S2 normal, no murmur, click, rub or gallop Abdomen: soft, non-tender; bowel sounds normal; no masses,  no organomegaly Extremities: extremities normal, atraumatic, no cyanosis or edema and Homans sign is negative, no sign of DVT Pulses: 2+ and symmetric Skin: Skin color, texture, turgor normal. No  rashes or lesions  Musculoskeletal:  ROM 0-100, Ligaments intact,  Imaging Review Plain radiographs demonstrate severe degenerative joint disease of the left knee. The overall alignment is mild valgus. The bone quality appears to be good for age and reported activity level.  Assessment/Plan: End stage arthritis, left knee   The patient history, physical examination and imaging studies are consistent with advanced degenerative joint disease of the left knee. The patient has failed conservative treatment.  The clearance notes were reviewed.  After discussion with the patient it was felt that Total Knee Replacement was indicated. The procedure,  risks, and benefits of total knee arthroplasty were presented and reviewed. The risks including but not limited to aseptic loosening, infection, blood clots, vascular injury, stiffness, patella tracking problems complications among others were discussed. The patient acknowledged the explanation, agreed to proceed with the plan.  Altamese CabalMaurice Winslow Ederer 05/31/2013, 6:34 AM

## 2013-05-31 NOTE — Transfer of Care (Signed)
Immediate Anesthesia Transfer of Care Note  Patient: Anne ChapmanCora C Kudo  Procedure(s) Performed: Procedure(s): TOTAL KNEE ARTHROPLASTY (Left)  Patient Location: PACU  Anesthesia Type:General  Level of Consciousness: lethargic and responds to stimulation  Airway & Oxygen Therapy: Patient Spontanous Breathing and Patient connected to nasal cannula oxygen  Post-op Assessment: Report given to PACU RN and Post -op Vital signs reviewed and stable  Post vital signs: Reviewed and stable  Complications: No apparent anesthesia complications

## 2013-05-31 NOTE — Anesthesia Procedure Notes (Addendum)
Procedure Name: Intubation Date/Time: 05/31/2013 10:21 AM Performed by: Delia ChimesVOSH, ASHLEY E Pre-anesthesia Checklist: Patient identified, Timeout performed, Emergency Drugs available, Suction available and Patient being monitored Patient Re-evaluated:Patient Re-evaluated prior to inductionOxygen Delivery Method: Circle system utilized Preoxygenation: Pre-oxygenation with 100% oxygen Intubation Type: IV induction and Cricoid Pressure applied Ventilation: Mask ventilation without difficulty and Oral airway inserted - appropriate to patient size Laryngoscope Size: Mac and 3 Grade View: Grade II Tube type: Oral Tube size: 7.0 mm Number of attempts: 2 Airway Equipment and Method: Bougie stylet,  Stylet and Oral airway Placement Confirmation: ETT inserted through vocal cords under direct vision,  breath sounds checked- equal and bilateral and positive ETCO2 Secured at: 24 cm Tube secured with: Tape Dental Injury: Teeth and Oropharynx as per pre-operative assessment  Difficulty Due To: Difficult Airway- due to limited oral opening and Difficult Airway- due to dentition Future Recommendations: Recommend- induction with short-acting agent, and alternative techniques readily available Comments: DL x 1 by Delia ChimesAshley Vosh, CRNA with MAC 3 unsuccessful despite attempt with Bougie; unable to visualize vocal cords or feel Bougie placement against tracheal rings.  Pt. had prominent upper teeth with limited mouth opening intubation difficult.  DL x 1 by Dr. Chaney MallingHodierne with MAC 3 and slight cricoid pressure successful for ETT placement.  +EtCO2, B/L breath sounds to auscultation, & visualization of cords on his part.  Dentition and lips undamaged and in pre-op condition.    Anesthesia Regional Block:  Adductor canal block  Pre-Anesthetic Checklist: ,, timeout performed, Correct Patient, Correct Site, Correct Laterality, Correct Procedure, Correct Position, site marked, Risks and benefits discussed,  Surgical consent,   Pre-op evaluation,  At surgeon's request and post-op pain management  Laterality: Left  Prep: chloraprep       Needles:  Injection technique: Single-shot  Needle Type: Echogenic Needle     Needle Length: 9cm 9 cm Needle Gauge: 21 and 21 G    Additional Needles:  Procedures: ultrasound guided (picture in chart) Adductor canal block Narrative:  Start time: 05/31/2013 9:28 AM End time: 05/31/2013 9:38 AM Injection made incrementally with aspirations every 5 mL.  Performed by: Personally  Anesthesiologist: Dr Chaney MallingHodierne

## 2013-05-31 NOTE — Progress Notes (Signed)
Orthopedic Tech Progress Note Patient Details:  Anne ChapmanCora C Cox 03/31/1950 [id number] CPM applied to LLE with appropriate settings. OHF applied to bed. Footsie roll provided. CPM Left Knee CPM Left Knee: On Left Knee Flexion (Degrees): 90 Left Knee Extension (Degrees): 0   Asia R Thompson 05/31/2013, 1:06 PM

## 2013-05-31 NOTE — Progress Notes (Signed)
Utilization review completed.  

## 2013-05-31 NOTE — Op Note (Signed)
TOTAL KNEE REPLACEMENT OPERATIVE NOTE:  05/31/2013  4:08 PM  PATIENT:  Anne Cox  63 y.o. female  PRE-OPERATIVE DIAGNOSIS:  osteoarthritis left knee  POST-OPERATIVE DIAGNOSIS:  osteoarthritis left knee  PROCEDURE:  Procedure(s): TOTAL KNEE ARTHROPLASTY  SURGEON:  Surgeon(s): Dannielle HuhSteve Dalicia Kisner, MD  PHYSICIAN ASSISTANT: Altamese CabalMaurice Jones, [facility]  ANESTHESIA:   general  DRAINS: Hemovac  SPECIMEN: None  COUNTS:  Correct  TOURNIQUET:   Total Tourniquet Time Documented: Thigh (Left) - 69 minutes Total: Thigh (Left) - 69 minutes   DICTATION:  Indication for procedure:    The patient is a 63 y.o. female who has failed conservative treatment for osteoarthritis left knee.  Informed consent was obtained prior to anesthesia. The risks versus benefits of the operation were explain and in a way the patient can, and did, understand.   On the implant demand matching protocol, this patient scored 10.  Therefore, this patient was not receive a polyethylene insert with vitamin E which is a high demand implant.  Description of procedure:     The patient was taken to the operating room and placed under anesthesia.  The patient was positioned in the usual fashion taking care that all body parts were adequately padded and/or protected.  I foley catheter was not placed.  A tourniquet was applied and the leg prepped and draped in the usual sterile fashion.  The extremity was exsanguinated with the esmarch and tourniquet inflated to 350 mmHg.  Pre-operative range of motion was 0-95 degrees flexion.  The knee was in 5 degree of mild varus.  A midline incision approximately 6-7 inches long was made with a #10 blade.  A new blade was used to make a parapatellar arthrotomy going 2-3 cm into the quadriceps tendon, over the patella, and alongside the medial aspect of the patellar tendon.  A synovectomy was then performed with the #10 blade and forceps. I then elevated the deep MCL off the medial tibial  metaphysis subperiosteally around to the semimembranosus attachment.    I everted the patella and used calipers to measure patellar thickness.  I used the reamer to ream down to appropriate thickness to recreate the native thickness.  I then removed excess bone with the rongeur and sagittal saw.  I used the appropriately sized template and drilled the three lug holes.  I then put the trial in place and measured the thickness with the calipers to ensure recreation of the native thickness.  The trial was then removed and the patella subluxed and the knee brought into flexion.  A homan retractor was place to retract and protect the patella and lateral structures.  A Z-retractor was place medially to protect the medial structures.  The extra-medullary alignment system was used to make cut the tibial articular surface perpendicular to the anamotic axis of the tibia and in 3 degrees of posterior slope.  The cut surface and alignment jig was removed.  I then used the intramedullary alignment guide to make a 6 valgus cut on the distal femur.  I then marked out the epicondylar axis on the distal femur.  The posterior condylar axis measured 3 degrees.  I then used the anterior referencing sizer and measured the femur to be a size 8.  The 4-In-1 cutting block was screwed into place in external rotation matching the posterior condylar angle, making our cuts perpendicular to the epicondylar axis.  Anterior, posterior and chamfer cuts were made with the sagittal saw.  The cutting block and cut pieces were removed.  A lamina spreader was placed in 90 degrees of flexion.  The ACL, PCL, menisci, and posterior condylar osteophytes were removed.  A 10 mm spacer blocked was found to offer good flexion and extension gap balance after minimal in degree releasing.   The scoop retractor was then placed and the femoral finishing block was pinned in place.  The small sagittal saw was used as well as the lug drill to finish the femur.   The block and cut surfaces were removed and the medullary canal hole filled with autograft bone from the cut pieces.  The tibia was delivered forward in deep flexion and external rotation.  A size D tray was selected and pinned into place centered on the medial 1/3 of the tibial tubercle.  The reamer and keel was used to prepare the tibia through the tray.    I then trialed with the size 8 femur, size D tibia, a 10 mm insert and the 32 patella.  I had excellent flexion/extension gap balance, excellent patella tracking.  Flexion was full and beyond 120 degrees; extension was zero.  These components were chosen and the staff opened them to me on the back table while the knee was lavaged copiously and the cement mixed.  The soft tissue was infiltrated with 60cc of exparel 1.3% through a 21 gauge needle.  I cemented in the components and removed all excess cement.  The polyethylene tibial component was snapped into place and the knee placed in extension while cement was hardening.  The capsule was infilltrated with 30cc of .25% Marcaine with epinephrine.  A hemovac was place in the joint exiting superolaterally.  A pain pump was place superomedially superficial to the arthrotomy.  Once the cement was hard, the tourniquet was let down.  Hemostasis was obtained.  The arthrotomy was closed with figure-8 #1 vicryl sutures.  The deep soft tissues were closed with #0 vicryls and the subcuticular layer closed with a running #2-0 vicryl.  The skin was reapproximated and closed with skin staples.  The wound was dressed with xeroform, 4 x4's, 2 ABD sponges, a single layer of webril and a TED stocking.   The patient was then awakened, extubated, and taken to the recovery room in stable condition.  BLOOD LOSS:  300cc DRAINS: 1 hemovac, 1 pain catheter COMPLICATIONS:  None.  PLAN OF CARE: Admit to inpatient   PATIENT DISPOSITION:  PACU - hemodynamically stable.   Delay start of Pharmacological VTE agent (>24hrs)  due to surgical blood loss or risk of bleeding:  not applicable  Please fax a copy of this op note to my office at [phone] (please only include page 1 and 2 of the Case Information op note)

## 2013-06-01 ENCOUNTER — Encounter (HOSPITAL_COMMUNITY): Payer: Self-pay | Admitting: Orthopedic Surgery

## 2013-06-01 LAB — BASIC METABOLIC PANEL
BUN: 9 mg/dL (ref 6–23)
CO2: 28 mEq/L (ref 19–32)
Calcium: 8.4 mg/dL (ref 8.4–10.5)
Chloride: 98 mEq/L (ref 96–112)
Creatinine, Ser: 0.56 mg/dL (ref 0.50–1.10)
GFR calc Af Amer: >90 mL/min (ref >90)
Glucose, Bld: 214 mg/dL — ABNORMAL HIGH (ref 70–99)
Potassium: 3.7 mEq/L (ref 3.7–5.3)
Sodium: 137 mEq/L (ref 137–147)

## 2013-06-01 LAB — GLUCOSE, CAPILLARY
GLUCOSE-CAPILLARY: 192 mg/dL — AB (ref 70–99)
GLUCOSE-CAPILLARY: 178 mg/dL — AB (ref 70–99)
Glucose-Capillary: 175 mg/dL — ABNORMAL HIGH (ref 70–99)
Glucose-Capillary: 130 mg/dL — ABNORMAL HIGH (ref 70–99)
Glucose-Capillary: 137 mg/dL — ABNORMAL HIGH (ref 70–99)

## 2013-06-01 LAB — CBC
HEMATOCRIT: 33 % — AB (ref 36.0–46.0)
Hemoglobin: 10.8 g/dL — ABNORMAL LOW (ref 12.0–15.0)
MCH: 26.3 pg (ref 26.0–34.0)
MCHC: 32.7 g/dL (ref 30.0–36.0)
MCV: 80.5 fL (ref 78.0–100.0)
Platelets: 218 10*3/uL (ref 150–400)
RBC: 4.1 MIL/uL (ref 3.87–5.11)
RDW: 14.8 % (ref 11.5–15.5)
WBC: 10 10*3/uL (ref 4.0–10.5)

## 2013-06-01 MED ORDER — METHOCARBAMOL 500 MG PO TABS: 500.0000 mg | ORAL_TABLET | Freq: Four times a day (QID) | ORAL | Status: AC | PRN

## 2013-06-01 MED ORDER — OXYCODONE HCL 5 MG PO TABS: 5.0000 mg | ORAL_TABLET | ORAL | Status: AC | PRN

## 2013-06-01 MED ORDER — ENOXAPARIN SODIUM 40 MG/0.4ML ~~LOC~~ SOLN: 40.0000 mg | SUBCUTANEOUS | Status: AC

## 2013-06-01 MED ORDER — OXYCODONE HCL ER 10 MG PO T12A: 10.0000 mg | EXTENDED_RELEASE_TABLET | Freq: Two times a day (BID) | ORAL | Status: AC

## 2013-06-01 NOTE — Progress Notes (Signed)
Orthopedic Tech Progress Note Patient Details:  Micheline ChapmanCora C Turgeon 12/16/1950 [id number] On cpm at 7:50 pm LLE 0-90 Patient ID: Micheline ChapmanCora C Deramo, female   DOB: 11/10/1950, [age] y.o.   MRN: [id number]   Jennye Moccasinnthony Craig Ashby Moskal 06/01/2013, 7:51 PM

## 2013-06-01 NOTE — Progress Notes (Signed)
Inpatient Diabetes Program Recommendations  AACE/ADA: New Consensus Statement on Inpatient Glycemic Control (2013)  Target Ranges:  Prepandial:   less than 140 mg/dL      Peak postprandial:   less than 180 mg/dL (1-2 hours)      Critically ill patients:  140 - 180 mg/dL   Reason for Visit: Hyperglycemia.  A1C of 8.4  Diabetes history: Type 2 Outpatient Diabetes medications: Invokana 300 mg, Levemir 55 units bid, Victoza 1.8  Current orders for Inpatient glycemic control: Levemir 55 units bid, Moderate Novolog correction tid  Note:  Talked to patient regarding elevated A1C.  Has had more difficulty with glycemic control in conjunction with knee problems.  Interested in outpatient diabetes education either in [place], or possibly in the [place] or [place] area.  ([place]CP is in [place] and she has houses both in [place] and [place].)  Patient agreeable to me placing an outpatient diabetes education referral [facility].  She must wait until Home Health visits are complete before outpatient education can be started.  Patient knows she has to option to change her mind about [facility] if she decides to go closer to home or her PCP prefers another program.  Thank you.  Jazzmyn Filion S. Elsie [place]outh, RN, CNS, CDE Inpatient Diabetes Program, team pager [phone]

## 2013-06-01 NOTE — Progress Notes (Signed)
Physical Therapy Treatment Patient Details Name: Anne ChapmanCora C Cox MRN: [id number] DOB: 11/04/1950 Today's Date: 06/01/2013    History of Present Illness Patient is a 63 yo female admitted 05/31/13 now s/p Lt TKA.  Patient with h/o HTN, DM, and asthma.    PT Comments    Pt required VC for sequencing and hand placement for bed mobility and transfers.  Due to pain, pt was only able to ambulate 30 feet to get to and from the toilet in her room.  Pt relies heavily on bil. UE support on RW for ambulation and required min-A throughout today's session for VC and safety with mobility.  Will continue to follow.   Follow Up Recommendations  Home health PT;Supervision/Assistance - 24 hour     Equipment Recommendations  None recommended by PT    Recommendations for Other Services       Precautions / Restrictions Precautions Precautions: Knee Precaution Comments: Reviewed knee precautions with patient Restrictions Weight Bearing Restrictions: Yes LLE Weight Bearing: Weight bearing as tolerated    Mobility  Bed Mobility Overal bed mobility: Needs Assistance Bed Mobility: Supine to Sit     Supine to sit: Min assist     General bed mobility comments: VC for bed mobility sequencing, min-A to help slide hips around to EOB.  Transfers Overall transfer level: Needs assistance Equipment used: Rolling walker (2 wheeled) Transfers: Sit to/from Stand Sit to Stand: Min assist         General transfer comment: VC provided for safe use of RW, min-A for balance as pt assumes standing posture.  Pt transfered to toilet using L hand rail and min-A to manage gown and IV with VC for hand placement and sequencing to stand from toilet.  Ambulation/Gait Ambulation/Gait assistance: Min assist Ambulation Distance (Feet): 30 Feet Assistive device: Rolling walker (2 wheeled) Gait Pattern/deviations: Step-to pattern;Decreased stance time - left;Decreased step length - right;Antalgic Gait velocity:  decreased Gait velocity interpretation: Below normal speed for age/gender General Gait Details: Gait is very slow, pt hesitant to bear weight through L LE.  Min-A provided for safety with balance.   Stairs            Wheelchair Mobility    Modified Rankin (Stroke Patients Only)       Balance Overall balance assessment: Needs assistance Sitting-balance support: Bilateral upper extremity supported Sitting balance-Leahy Scale: Poor Sitting balance - Comments: Pt bearing weight heavily through B UE on bed in sitting, but was able to lift one arm at a time to don gown.   Standing balance support: Bilateral upper extremity supported Standing balance-Leahy Scale: Poor Standing balance comment: Pt using bil. UE on RW to support weight when swinging R LE forward.                    Cognition Arousal/Alertness: Awake/alert Behavior During Therapy: Anxious Overall Cognitive Status: Within Functional Limits for tasks assessed                      Exercises Total Joint Exercises Ankle Circles/Pumps: AROM;Both;10 reps;Seated    General Comments        Pertinent Vitals/Pain Pt reports pain with weight bearing.  Pt premedicated.    Home Living                      Prior Function            PT Goals (current goals can now be found in  the care plan section) Acute Rehab PT Goals Patient Stated Goal: None stated PT Goal Formulation: With patient/family Time For Goal Achievement: 06/07/13 Potential to Achieve Goals: Good Progress towards PT goals: Progressing toward goals    Frequency  7X/week    PT Plan Current plan remains appropriate    Co-evaluation             End of Session Equipment Utilized During Treatment: Gait belt Activity Tolerance: Patient limited by pain Patient left: in chair;with call bell/phone within reach     Time: 0943-1028 PT Time Calculation (min): 45 min  Charges:                       G Codes:       Anne Cox, SPT 06/01/2013, 11:37 AM

## 2013-06-01 NOTE — Care Management Note (Signed)
CARE MANAGEMENT NOTE 06/01/2013  Patient:  Anne Cox,Anne Cox   Account Number:  [id number]  Date Initiated:  06/01/2013  Documentation initiated by:  Vance PeperBRADY,Jakeira Seeman  Subjective/Objective Assessment:   63 yr old female admitted with osteoarthritis of left knee. S/p left total knee arthroplasty.     Action/Plan:   Case manager spoke with patient concerning home health and DME needs at discharge. [facility], no changes. Patient has family support at discharge.   Anticipated DC Date:  06/02/2013   Anticipated DC Plan:  HOME W HOME HEALTH SERVICES      DC Planning Services  CM consult      [facility]  HOME HEALTH  DURABLE MEDICAL EQUIPMENT   Choice offered to / List presented to:  [facility]-1 Patient   DME arranged  [facility]  CPM  3-N-1      DME agency  [employer]     HH arranged  HH-2 PT      HH [facility]  [facility]   Status of service:  Completed, signed off Medicare Important Message given?   (If response is "NO", the following Medicare IM given date fields will be blank) Date Medicare IM given:   Date Additional Medicare IM given:    Discharge Disposition:  HOME W HOME HEALTH SERVICES  Per UR Regulation:  Reviewed for med. necessity/level of care/duration of stay  If discussed at Long Length of Stay Meetings, dates discussed:    Comments:

## 2013-06-01 NOTE — Progress Notes (Signed)
SPORTS MEDICINE AND JOINT REPLACEMENT  Anne SpurlingStephen Lucey, MD   Anne CabalMaurice Nazariah Cadet, PA-C Anne [place], [place], [place]  [id number]                             [phone]   PROGRESS NOTE  Subjective:  negative for Chest Pain  negative for Shortness of Breath  positive for Nausea/Vomiting   negative for Calf Pain  negative for Bowel Movement   Tolerating Diet: yes         Patient reports pain as 5 on 0-10 scale.    Objective: Vital signs in last 24 hours:   Patient Vitals for the past 24 hrs:  BP Temp Pulse Resp SpO2  06/01/13 1342 161/66 mmHg 98.4 F (36.9 C) 83 18 95 %  06/01/13 0517 157/69 mmHg 98.5 F (36.9 C) 83 18 100 %  06/01/13 0147 173/66 mmHg 98.3 F (36.8 C) 90 18 96 %  05/31/13 2107 159/70 mmHg 98.6 F (37 C) 84 18 95 %    @flow {1959:LAST@   Intake/Output from previous day:   05/18 0701 - 05/19 0700 In: 700 [I.V.:700] Out: 825 [Urine:450; Drains:275]   Intake/Output this shift:   05/19 0701 - 05/19 1900 In: 600 [P.O.:600] Out: 2100 [Urine:1850; Drains:250]   Intake/Output     05/18 0701 - 05/19 0700 05/19 0701 - 05/20 0700   P.O.  600   I.V. (mL/kg) 700 (6.6)    Total Intake(mL/kg) 700 (6.6) 600 (5.7)   Urine (mL/kg/hr) 450 1850 (1.7)   Drains 275 250 (0.2)   Blood 100    Total Output 825 2100   Net -125 -1500        Urine Occurrence 1 x       LABORATORY DATA:  Recent Labs  05/31/13 1700 06/01/13 0539  WBC 12.9* 10.0  HGB 12.2 10.8*  HCT 37.9 33.0*  PLT 198 218    Recent Labs  05/31/13 1700 06/01/13 0539  NA  --  137  K  --  3.7  CL  --  98  CO2  --  28  BUN  --  9  CREATININE 0.51 0.56  GLUCOSE  --  214*  CALCIUM  --  8.4   Lab Results  Component Value Date   INR 0.91 05/24/2013    Examination:  General appearance: alert, cooperative and no distress Extremities: extremities normal, atraumatic, no cyanosis or edema and Homans sign is negative, no sign of DVT  Wound Exam: clean, dry, intact   Drainage:  Scant/small  amount Serosanguinous exudate  Motor Exam: EHL and FHL Intact  Sensory Exam: Deep Peroneal normal   Assessment:    1 Day Post-Op  Procedure(s) (LRB): TOTAL KNEE ARTHROPLASTY (Left)  ADDITIONAL DIAGNOSIS:  Active Problems:   S/P total knee arthroplasty  Acute Blood Loss Anemia   Plan: Physical Therapy as ordered Weight Bearing as Tolerated (WBAT)  DVT Prophylaxis:  Lovenox  DISCHARGE PLAN: Home  DISCHARGE NEEDS: HHPT, CPM, Walker and 3-in-1 comode seat         Anne CabalMaurice Laticha Ferrucci 06/01/2013, 5:11 PM

## 2013-06-01 NOTE — Progress Notes (Signed)
Agree with SPT.    Jasman Pfeifle, PT [phone]  

## 2013-06-01 NOTE — Discharge Instructions (Signed)
Diet: As you were doing prior to hospitalization   Activity:  Increase activity slowly as tolerated                  No lifting or driving for 6 weeks  Shower:  May shower without a dressing once there is no drainage from your wound.                 Do NOT wash over the wound.                 Dressing:  You may change your dressing on Thursday                    Then change the dressing daily with sterile 4"x4"s gauze dressing                     And TED hose for knees.  Weight Bearing:  Weight bearing as tolerated as taught in physical therapy.  Use a                                walker or Crutches as instructed.  To prevent constipation: you may use a stool softener such as -               Colace ( over the counter) 100 mg by mouth twice a day                Drink plenty of fluids ( prune juice may be helpful) and high fiber foods                Miralax ( over the counter) for constipation as needed.    Precautions:  If you experience chest pain or shortness of breath - call 911 immediately               For transfer to the hospital emergency department!!               If you develop a fever greater that 101 F, purulent drainage from wound,                             increased redness or drainage from wound, or calf pain -- Call the office.  Follow- Up Appointment:  Please call for an appointment to be seen on 06/15/13                                              [facility] office:  [phone]            [address] [place], [place] [address]

## 2013-06-01 NOTE — Progress Notes (Signed)
Discharge written for tomorrow 5/20. Patient continues to ambulate with one person assist to brp and bsc/chair. Hemovac has no drainage for the past 4 hours.

## 2013-06-02 LAB — GLUCOSE, CAPILLARY
Glucose-Capillary: 100 mg/dL — ABNORMAL HIGH (ref 70–99)
Glucose-Capillary: 69 mg/dL — ABNORMAL LOW (ref 70–99)
Glucose-Capillary: 110 mg/dL — ABNORMAL HIGH (ref 70–99)
Glucose-Capillary: 165 mg/dL — ABNORMAL HIGH (ref 70–99)

## 2013-06-02 LAB — CBC
HEMATOCRIT: 31.9 % — AB (ref 36.0–46.0)
Hemoglobin: 10.5 g/dL — ABNORMAL LOW (ref 12.0–15.0)
MCH: 26.6 pg (ref 26.0–34.0)
MCHC: 32.9 g/dL (ref 30.0–36.0)
MCV: 80.8 fL (ref 78.0–100.0)
PLATELETS: 201 10*3/uL (ref 150–400)
RBC: 3.95 MIL/uL (ref 3.87–5.11)
RDW: 14.7 % (ref 11.5–15.5)
WBC: 8.9 10*3/uL (ref 4.0–10.5)

## 2013-06-02 MED ORDER — INSULIN DETEMIR 100 UNIT/ML ~~LOC~~ SOLN
55.0000 [IU] | Freq: Every day | SUBCUTANEOUS | Status: DC
  Filled 2013-06-02: qty 0.55

## 2013-06-02 NOTE — Progress Notes (Signed)
Physical Therapy Treatment Patient Details Name: Anne ChapmanCora C Cox MRN: [id number] DOB: 12/05/1950 Today's Date: 06/02/2013    History of Present Illness Patient is a 63 yo female admitted 05/31/13 now s/p Lt TKA.  Patient with h/o HTN, DM, and asthma.    PT Comments    Pt has ambulated greater than 150 ft with RW and min-A for balance today.  Pt was also able to ascend/descend one step with RW with VC for sequencing and use of RW with min-A for safety during learning of step strategy.  Pt continues to ambulate slowly with decreased WB through L LE, but gait speed is improved from yesterday.  Will continue to follow.   Follow Up Recommendations  Home health PT;Supervision/Assistance - 24 hour     Equipment Recommendations  None recommended by PT    Recommendations for Other Services       Precautions / Restrictions Precautions Precautions: Knee Precaution Comments: Reviewed knee precautions with patient Restrictions Weight Bearing Restrictions: Yes LLE Weight Bearing: Weight bearing as tolerated    Mobility  Bed Mobility               General bed mobility comments: Pt in chair upon PT arrival  Transfers Overall transfer level: Needs assistance Equipment used: Rolling walker (2 wheeled) Transfers: Sit to/from Stand Sit to Stand: Min guard         General transfer comment: Pt pushes heavily through bil. UE on chair to come to standing with L LE extended in front of her.  Pt descends into sitting very slowly with great effort to avoid bending L LE as much as possible.  Min guard for safety.  Ambulation/Gait Ambulation/Gait assistance: Min assist Ambulation Distance (Feet): 150 Feet Assistive device: Rolling walker (2 wheeled) Gait Pattern/deviations: Step-through pattern;Decreased stance time - left;Decreased step length - right;Decreased weight shift to left Gait velocity: decreased Gait velocity interpretation: Below normal speed for age/gender General Gait  Details: Ambulated with slow gait and decreased WB through L LE.  Pt reports no increase in pain with ambulation.  Min-A provided for safety.   Stairs Stairs: Yes Stairs assistance: Min assist Stair Management: No rails;Backwards;Forwards;With walker Number of Stairs: 1 General stair comments: VC provided for use of walker and sequencing when ascending/descending step.  Min-A provided for safety with balance.  Wheelchair Mobility    Modified Rankin (Stroke Patients Only)       Balance Overall balance assessment: Needs assistance Sitting-balance support: Feet supported;No upper extremity supported Sitting balance-Leahy Scale: Fair     Standing balance support: Bilateral upper extremity supported Standing balance-Leahy Scale: Poor Standing balance comment: Pt continues to rely heavily on bil. UE support on RW for standing balance due to decreased WB in L LE.                    Cognition Arousal/Alertness: Awake/alert Behavior During Therapy: WFL for tasks assessed/performed Overall Cognitive Status: Within Functional Limits for tasks assessed                      Exercises Total Joint Exercises Goniometric ROM: 5-90    General Comments        Pertinent Vitals/Pain Pt reports no increase in pain with therapy.  Pt premedicated.    Home Living Family/patient expects to be discharged to:: Private residence Living Arrangements: Spouse/significant other Available Help at Discharge: Family;Available 24 hours/day Type of Home: House Home Access: Stairs to enter Entrance Stairs-Rails: None Home Layout: One  level Home Equipment: Walker - 2 wheels;Bedside commode      Prior Function Level of Independence: Independent          PT Goals (current goals can now be found in the care plan section) Acute Rehab PT Goals Patient Stated Goal: To sit down easier from standing PT Goal Formulation: With patient/family Time For Goal Achievement: 06/07/13 Potential  to Achieve Goals: Good Progress towards PT goals: Progressing toward goals    Frequency  7X/week    PT Plan Current plan remains appropriate    Co-evaluation             End of Session Equipment Utilized During Treatment: Gait belt Activity Tolerance: Patient tolerated treatment well Patient left: in chair;with call bell/phone within reach     Time: 0953-1017 PT Time Calculation (min): 24 min  Charges:                       G Codes:      Iyari Hagner, SPT 06/02/2013, 10:35 AM

## 2013-06-02 NOTE — Progress Notes (Signed)
SPORTS MEDICINE AND JOINT REPLACEMENT  Anne SpurlingStephen Lucey, MD   Anne CabalMaurice Ilyaas Musto, PA-C [address], [place], [place]  [address]                             [phone]   PROGRESS NOTE  Subjective:  negative for Chest Pain  negative for Shortness of Breath  negative for Nausea/Vomiting   negative for Calf Pain  negative for Bowel Movement   Tolerating Diet: yes         Patient reports pain as 3 on 0-10 scale.    Objective: Vital signs in last 24 hours:   Patient Vitals for the past 24 hrs:  BP Temp Pulse Resp SpO2  06/02/13 1300 149/58 mmHg 98.1 F (36.7 C) 76 18 96 %  06/02/13 0528 139/60 mmHg 99.1 F (37.3 C) 79 16 98 %  06/02/13 0400 - - - 18 -  06/02/13 0000 - - - 18 -  06/01/13 2049 170/65 mmHg 99.6 F (37.6 C) 90 16 97 %  06/01/13 2000 - - - 18 -    @flow {1959:LAST@   Intake/Output from previous day:   05/19 0701 - 05/20 0700 In: 1320 [P.O.:1320] Out: 2100 [Urine:1850; Drains:250]   Intake/Output this shift:   05/20 0701 - 05/20 1900 In: 600 [P.O.:600] Out: -    Intake/Output     05/19 0701 - 05/20 0700 05/20 0701 - 05/21 0700   P.O. 1320 600   I.V. (mL/kg)     Total Intake(mL/kg) 1320 (12.4) 600 (5.7)   Urine (mL/kg/hr) 1850 (0.7)    Drains 250 (0.1)    Blood     Total Output 2100     Net -780 +600        Urine Occurrence 3 x 2 x      LABORATORY DATA:  Recent Labs  05/31/13 1700 06/01/13 0539 06/02/13 0615  WBC 12.9* 10.0 8.9  HGB 12.2 10.8* 10.5*  HCT 37.9 33.0* 31.9*  PLT 198 218 201    Recent Labs  05/31/13 1700 06/01/13 0539  NA  --  137  K  --  3.7  CL  --  98  CO2  --  28  BUN  --  9  CREATININE 0.51 0.56  GLUCOSE  --  214*  CALCIUM  --  8.4   Lab Results  Component Value Date   INR 0.91 05/24/2013    Examination:  General appearance: alert, cooperative and no distress Extremities: extremities normal, atraumatic, no cyanosis or edema and Homans sign is negative, no sign of DVT  Wound Exam: clean, dry, intact    Drainage:  None: wound tissue dry  Motor Exam: EHL and FHL Intact  Sensory Exam: Deep Peroneal normal   Assessment:    2 Days Post-Op  Procedure(s) (LRB): TOTAL KNEE ARTHROPLASTY (Left)  ADDITIONAL DIAGNOSIS:  Active Problems:   S/P total knee arthroplasty  Acute Blood Loss Anemia   Plan: Physical Therapy as ordered Weight Bearing as Tolerated (WBAT)  DVT Prophylaxis:  Lovenox  DISCHARGE PLAN: Home  DISCHARGE NEEDS: HHPT, CPM, Walker and 3-in-1 comode seat         Anne CabalMaurice Aamani Cox 06/02/2013, 4:43 PM

## 2013-06-02 NOTE — Progress Notes (Signed)
Inpatient Diabetes Program Recommendations  AACE/ADA: New Consensus Statement on Inpatient Glycemic Control (2013)  Target Ranges:  Prepandial:   less than 140 mg/dL      Peak postprandial:   less than 180 mg/dL (1-2 hours)      Critically ill patients:  140 - 180 mg/dL  Results for Anne Cox, Anne Cox (MRN [id number]) as of 06/02/2013 09:14  Ref. Range 05/31/2013 16:40 05/31/2013 21:25 06/01/2013 06:49 06/01/2013 11:40 06/01/2013 16:26 06/01/2013 21:21 06/02/2013 06:45 06/02/2013 07:06  Glucose-Capillary Latest Range: 70-99 mg/dL 409175 (H) 811215 (H) 914192 (H) 178 (H) 130 (H) 137 (H) 69 (L) 100 (H)   Inpatient Diabetes Program Recommendations Insulin - Basal: consider decrease Levemir to 45 BID Thank you  Piedad ClimesGina Izaak Sahr BSN, RN,CDE Inpatient Diabetes Coordinator [phone] (team pager)

## 2013-06-02 NOTE — Progress Notes (Signed)
Pt discharged to home. D/c instructions given, no questions verbalized. Vitals stable. 

## 2013-06-02 NOTE — Evaluation (Signed)
Occupational Therapy Evaluation Patient Details Name: Anne Cox MRN: [id number] DOB: 05/22/1950 Today's Date: 06/02/2013    History of Present Illness Patient is a 63 yo female admitted 05/31/13 now s/p Lt TKA.  Patient with h/o HTN, DM, and asthma.   Clinical Impression   Pt admitted with the above diagnoses and presents with below problem list. Pt will benefit from continued acute OT to address the below listed deficits and maximize independence with basic ADLs prior to d/c. Pt performed transfers and ambulation min guard with extra time needed. Educated pt on techniques and AE for safe completion of ADLs with knee precautions.     Follow Up Recommendations  No OT follow up    Equipment Recommendations  Other (comment) (reacher)    Recommendations for Other Services       Precautions / Restrictions Precautions Precautions: Knee Restrictions Weight Bearing Restrictions: Yes LLE Weight Bearing: Weight bearing as tolerated      Mobility Bed Mobility               General bed mobility comments: bed mobility not assessed - pt in chair upon arrival  Transfers Overall transfer level: Needs assistance Equipment used: Rolling walker (2 wheeled) Transfers: Sit to/from Stand Sit to Stand: Min guard         General transfer comment: min guard sit<> stand from recliner and BSC. Pt stands erect after initially bending over upon standing.    Balance Overall balance assessment: Needs assistance Sitting-balance support: Feet supported;No upper extremity supported Sitting balance-Leahy Scale: Fair     Standing balance support: Bilateral upper extremity supported;During functional activity Standing balance-Leahy Scale: Poor                              ADL Overall ADL's : Needs assistance/impaired Eating/Feeding: Set up;Sitting   Grooming: Set up;Sitting   Upper Body Bathing: Supervision/ safety;Sitting   Lower Body Bathing: Min guard;With adaptive  equipment;Sit to/from stand   Upper Body Dressing : Set up;Sitting   Lower Body Dressing: Min guard;With adaptive equipment;Sit to/from stand   Toilet Transfer: Min guard;Ambulation;RW (3n1 over toilet)   Toileting- Clothing Manipulation and Hygiene: Min guard;Sit to/from stand   Tub/ Shower Transfer: Min guard;Ambulation;3 in 1;Rolling walker   Functional mobility during ADLs: Min guard General ADL Comments: Pt ambulated with rw to 3n1 over toilet min guard with extra time needed. Cues for sequencing with rw and min guard needed for toilet transfer. Educated pt on techniques and AE for safe completion of ADLs with knee precautions.      Vision                     Perception     Praxis      Pertinent Vitals/Pain 5-6/10 at end of session. Repositioned.     Hand Dominance     Extremity/Trunk Assessment Upper Extremity Assessment Upper Extremity Assessment: Overall WFL for tasks assessed   Lower Extremity Assessment Lower Extremity Assessment: Defer to PT evaluation       Communication Communication Communication: No difficulties   Cognition Arousal/Alertness: Awake/alert Behavior During Therapy: WFL for tasks assessed/performed Overall Cognitive Status: Within Functional Limits for tasks assessed                     General Comments       Exercises       Shoulder Instructions      Home  Living Family/patient expects to be discharged to:: Private residence Living Arrangements: Spouse/significant other Available Help at Discharge: Family;Available 24 hours/day Type of Home: House Home Access: Stairs to enter [employer] CorporationEntrance Stairs-Number of Steps: 1 Entrance Stairs-Rails: None Home Layout: One level     Bathroom Shower/Tub: Tub/shower unit;Door Shower/tub characteristics: Door [employer] IndustriesBathroom Toilet: Standard Bathroom Accessibility: Yes How Accessible: Accessible via walker Home Equipment: Walker - 2 wheels;Bedside commode          Prior  Functioning/Environment Level of Independence: Independent             OT Diagnosis: Acute pain   OT Problem List: Pain;Decreased knowledge of use of DME or AE;Decreased knowledge of precautions;Impaired balance (sitting and/or standing)   OT Treatment/Interventions: Self-care/ADL training;Therapeutic exercise;DME and/or AE instruction;Therapeutic activities;Patient/family education;Balance training    OT Goals(Current goals can be found in the care plan section) Acute Rehab OT Goals Patient Stated Goal: to walk better OT Goal Formulation: With patient Time For Goal Achievement: 06/09/13 Potential to Achieve Goals: Good ADL Goals Pt Will Perform Lower Body Dressing: with supervision;with adaptive equipment;sit to/from stand Pt Will Transfer to Toilet: with supervision;ambulating Pt Will Perform Toileting - Clothing Manipulation and hygiene: with supervision;with adaptive equipment;sit to/from stand Pt Will Perform Tub/Shower Transfer: with supervision;ambulating;3 in 1;rolling walker  OT Frequency: Min 2X/week   Barriers to D/C:            Co-evaluation              End of Session Equipment Utilized During Treatment: Gait belt;Rolling walker  Activity Tolerance: Patient tolerated treatment well Patient left: in chair;with call bell/phone within reach   Time: 0907-0932 OT Time Calculation (min): 25 min Charges:  OT General Charges $OT Visit: 1 Procedure OT Evaluation $Initial OT Evaluation Tier I: 1 Procedure OT Treatments $Self Care/Home Management : 8-22 mins G-Codes:    Pilar GrammesKathryn H Alfhild Partch 06/02/2013, 9:48 AM

## 2013-06-02 NOTE — Progress Notes (Signed)
Agree with SPT.    Clarice Zulauf, PT [phone]  

## 2013-06-08 NOTE — Discharge Summary (Signed)
SPORTS MEDICINE & JOINT REPLACEMENT   Georgena Spurling, MD   Altamese Cabal, PA-C [address] [place], [place], [place]  [address]                             [phone]  PATIENT ID: Anne Cox        MRN:  [id number]          DOB/AGE: 1950-04-20 / 63 y.o.    DISCHARGE SUMMARY  ADMISSION DATE:    05/31/2013 DISCHARGE DATE:   06/02/2013  ADMISSION DIAGNOSIS: osteoarthritis left knee    DISCHARGE DIAGNOSIS:  osteoarthritis left knee    ADDITIONAL DIAGNOSIS: Active Problems:   S/P total knee arthroplasty  Past Medical History  Diagnosis Date  . Hypertension   . Dysrhythmia     IRREG HEARTBEAT  . Stroke     1980  . Cerebral aneurysm     1984   RESOLVED  . Asthma   . Diabetes mellitus without complication   . GERD (gastroesophageal reflux disease)   . Arthritis     PROCEDURE: Procedure(s): TOTAL KNEE ARTHROPLASTY on 05/31/2013  CONSULTS:     HISTORY:  See H&P in chart  HOSPITAL COURSE:  JENASIS GASCA is a 63 y.o. admitted on 05/31/2013 and found to have a diagnosis of osteoarthritis left knee.  After appropriate laboratory studies were obtained  they were taken to the operating room on 05/31/2013 and underwent Procedure(s): TOTAL KNEE ARTHROPLASTY.   They were given perioperative antibiotics:  Anti-infectives   Start     Dose/Rate Route Frequency Ordered Stop   05/31/13 1700  terbinafine (LAMISIL) tablet 250 mg  Status:  Discontinued     250 mg Oral Daily 05/31/13 1556 06/02/13 2024   05/31/13 1700  ceFAZolin (ANCEF) IVPB 2 g/50 mL premix     2 g 100 mL/hr over 30 Minutes Intravenous Every 6 hours 05/31/13 1556 05/31/13 2349   05/31/13 1400  ceFAZolin (ANCEF) IVPB 1 g/50 mL premix  Status:  Discontinued     1 g 100 mL/hr over 30 Minutes Intravenous 3 times per day 05/31/13 1019 05/31/13 1556   05/31/13 0835  ceFAZolin (ANCEF) 2-3 GM-% IVPB SOLR    Comments:  Elliott, Beth   : cabinet override      05/31/13 0835 05/31/13 1025   05/30/13 1037  ceFAZolin (ANCEF)  IVPB 2 g/50 mL premix  Status:  Discontinued     2 g 100 mL/hr over 30 Minutes Intravenous On call to O.R. 05/30/13 1037 05/31/13 1556    .  Tolerated the procedure well.  Placed with a foley intraoperatively.  Given Ofirmev at induction and for 48 hours.    POD# 1: Vital signs were stable.  Patient denied Chest pain, shortness of breath, or calf pain.  Patient was started on Lovenox 30 mg subcutaneously twice daily at 8am.  Consults to PT, OT, and care management were made.  The patient was weight bearing as tolerated.  CPM was placed on the operative leg 0-90 degrees for 6-8 hours a day.  Incentive spirometry was taught.  Dressing was changed.  Marcaine pump and hemovac were discontinued.      POD #2, Continued  PT for ambulation and exercise program.  IV saline locked.  O2 discontinued.    The remainder of the hospital course was dedicated to ambulation and strengthening.   The patient was discharged on 2 days posy op in  Good condition.  Blood products given:none  DIAGNOSTIC STUDIES: Recent vital signs: No data found.      Recent laboratory studies:  Recent Labs  06/02/13 0615  WBC 8.9  HGB 10.5*  HCT 31.9*  PLT 201   No results found for this basename: NA, K, CL, CO2, BUN, CREATININE, GLUCOSE, CALCIUM,  in the last 168 hours Lab Results  Component Value Date   INR 0.91 05/24/2013     Recent Radiographic Studies :  Dg Chest 2 View  05/24/2013   CLINICAL DATA:  Preop for total knee arthroplasty.  Hypertension.  EXAM: CHEST  2 VIEW  COMPARISON:  None.  FINDINGS: The heart size and mediastinal contours are within normal limits. Both lungs are clear. The visualized skeletal structures are unremarkable.  IMPRESSION: No active cardiopulmonary disease.   Electronically Signed   By: Amie Portland M.D.   On: 05/24/2013 11:45    DISCHARGE INSTRUCTIONS: Discharge Instructions   Ambulatory referral to Nutrition and Diabetic Education    Complete by:  As directed   A1C 8.4.   Glycemic control worse since less active.  Patient lives in [place] and [place] areas.  Gets some health care in [place] area but PCP is in [place].  Had total knee replacement on left.  To have right knee surgery after left side heals.  Will have Home Health for a while after discharge-- so needs to wait to come for OP Ed after Home Health services complete.  Please contact patient at [phone] to set appt.  She may decide to go for education closer to home.     CPM    Complete by:  As directed   Continuous passive motion machine (CPM):      Use the CPM from 0 to 90 for 6-8 hours per day.      You may increase by 10 per day.  You may break it up into 2 or 3 sessions per day.      Use CPM for 2 weeks or until you are told to stop.     Call MD / Call 911    Complete by:  As directed   If you experience chest pain or shortness of breath, CALL 911 and be transported to the hospital emergency room.  If you develope a fever above 101 F, pus (white drainage) or increased drainage or redness at the wound, or calf pain, call your surgeon's office.     Change dressing    Complete by:  As directed   Change dressing on Thursday, then change the dressing daily with sterile 4 x 4 inch gauze dressing and apply TED hose.     Constipation Prevention    Complete by:  As directed   Drink plenty of fluids.  Prune juice may be helpful.  You may use a stool softener, such as Colace (over the counter) 100 mg twice a day.  Use MiraLax (over the counter) for constipation as needed.     Diet - low sodium heart healthy    Complete by:  As directed      Do not put a pillow under the knee. Place it under the heel.    Complete by:  As directed      Driving restrictions    Complete by:  As directed   No driving for 6 weeks     Increase activity slowly as tolerated    Complete by:  As directed      Lifting restrictions    Complete by:  As directed   No lifting for 6 weeks     TED hose    Complete by:  As directed    Use stockings (TED hose) for 3 weeks on both leg(s).  You may remove them at night for sleeping.           DISCHARGE MEDICATIONS:     Medication List    STOP taking these medications       aspirin EC 81 MG tablet     omega-3 acid ethyl esters 1 G capsule  Commonly known as:  LOVAZA      TAKE these medications       amLODipine 10 MG tablet  Commonly known as:  NORVASC  Take 10 mg by mouth daily.     atorvastatin 20 MG tablet  Commonly known as:  LIPITOR  Take 20 mg by mouth daily.     cetirizine 10 MG tablet  Commonly known as:  ZYRTEC  Take 10 mg by mouth daily.     enoxaparin 40 MG/0.4ML injection  Commonly known as:  LOVENOX  Inject 0.4 mLs (40 mg total) into the skin daily.     esomeprazole 40 MG capsule  Commonly known as:  NEXIUM  Take 40 mg by mouth daily as needed (for reflux).     INVOKANA 300 MG Tabs  Generic drug:  Canagliflozin  Take 300 mg by mouth daily.     KERYDIN 5 % Soln  Generic drug:  Tavaborole  Apply 1 application topically daily.     LEVEMIR FLEXPEN 100 UNIT/ML Pen  Generic drug:  Insulin Detemir  Inject 55 Units into the skin 2 (two) times daily.     losartan-hydrochlorothiazide 100-25 MG per tablet  Commonly known as:  HYZAAR  Take 1 tablet by mouth daily.     methocarbamol 500 MG tablet  Commonly known as:  ROBAXIN  Take 1-2 tablets (500-1,000 mg total) by mouth every 6 (six) hours as needed for muscle spasms.     metoprolol succinate 50 MG 24 hr tablet  Commonly known as:  TOPROL-XL  Take 50 mg by mouth 2 (two) times daily. Take with or immediately following a meal.     mometasone 50 MCG/ACT nasal spray  Commonly known as:  NASONEX  Place 2 sprays into the nose daily.     multivitamin with minerals tablet  Take 1 tablet by mouth daily.     oxyCODONE 5 MG immediate release tablet  Commonly known as:  Oxy IR/ROXICODONE  Take 1-2 tablets (5-10 mg total) by mouth every 4 (four) hours as needed for breakthrough pain.      OxyCODONE 10 mg T12a 12 hr tablet  Commonly known as:  OXYCONTIN  Take 1 tablet (10 mg total) by mouth every 12 (twelve) hours.     terbinafine 250 MG tablet  Commonly known as:  LAMISIL  Take 250 mg by mouth daily.     VICTOZA 18 MG/3ML Sopn  Generic drug:  Liraglutide  Inject 1.8 Units into the skin.        FOLLOW UP VISIT:       Follow-up Information   [facility] ([facility] will contact you concerning start date and time for physical  therapy.)    Contact information:   [address] [place] [address] [phone]       Follow up with Raymon MuttonLUCEY,STEPHEN D, MD. Call on 06/01/2013.   Specialty:  Orthopedic Surgery  Contact information:   [address] [place] [address] [phone]       DISPOSITION: HOME   CONDITION:  Noralyn Pick 06/08/2013, 7:37 AM

## 2015-05-10 IMAGING — CR DG CHEST 2V
2 series · 2 of 2 positions shown · non-contrast
Comparison: None.

CLINICAL DATA: Preop for total knee arthroplasty.  Hypertension.

EXAM:
CHEST  2 VIEW

[w chest pa]
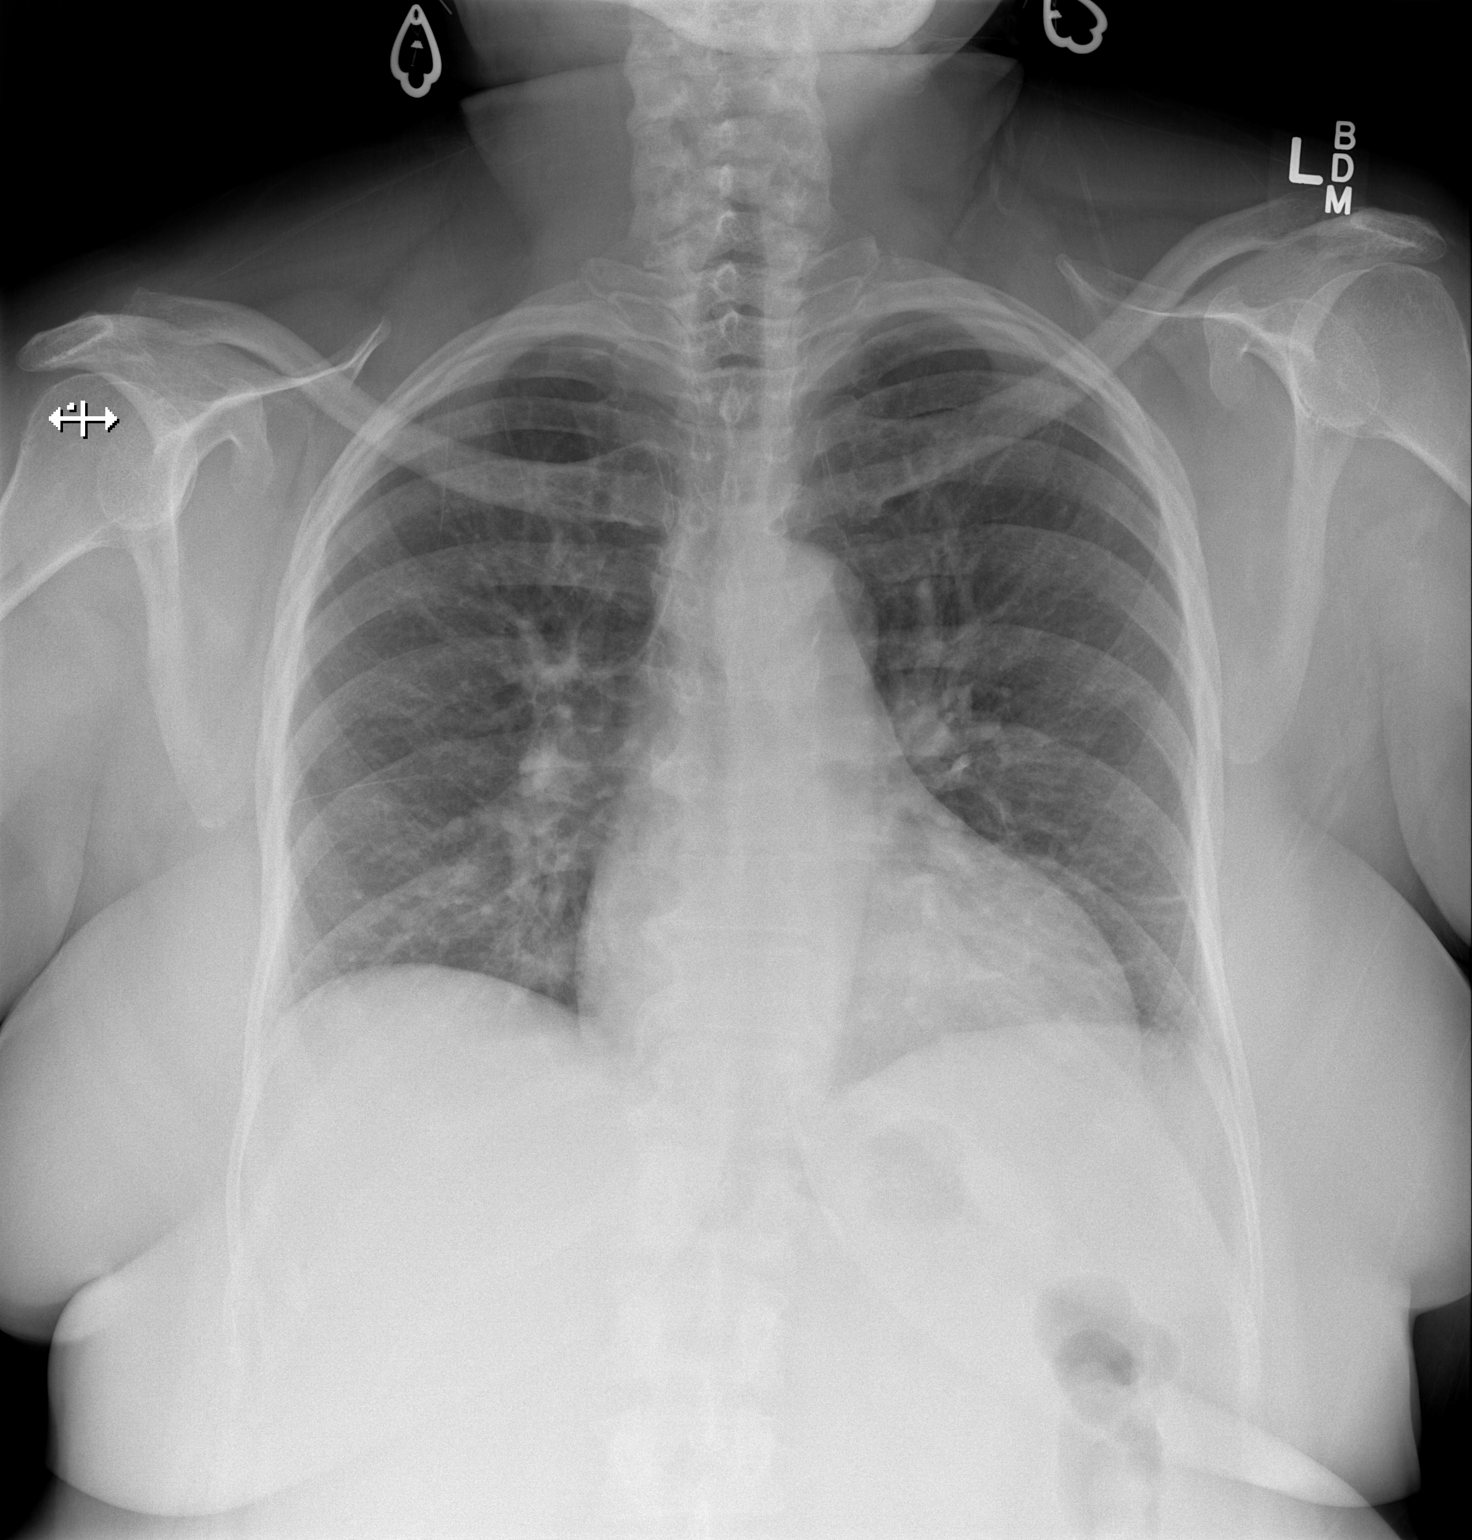

[w chest lat]
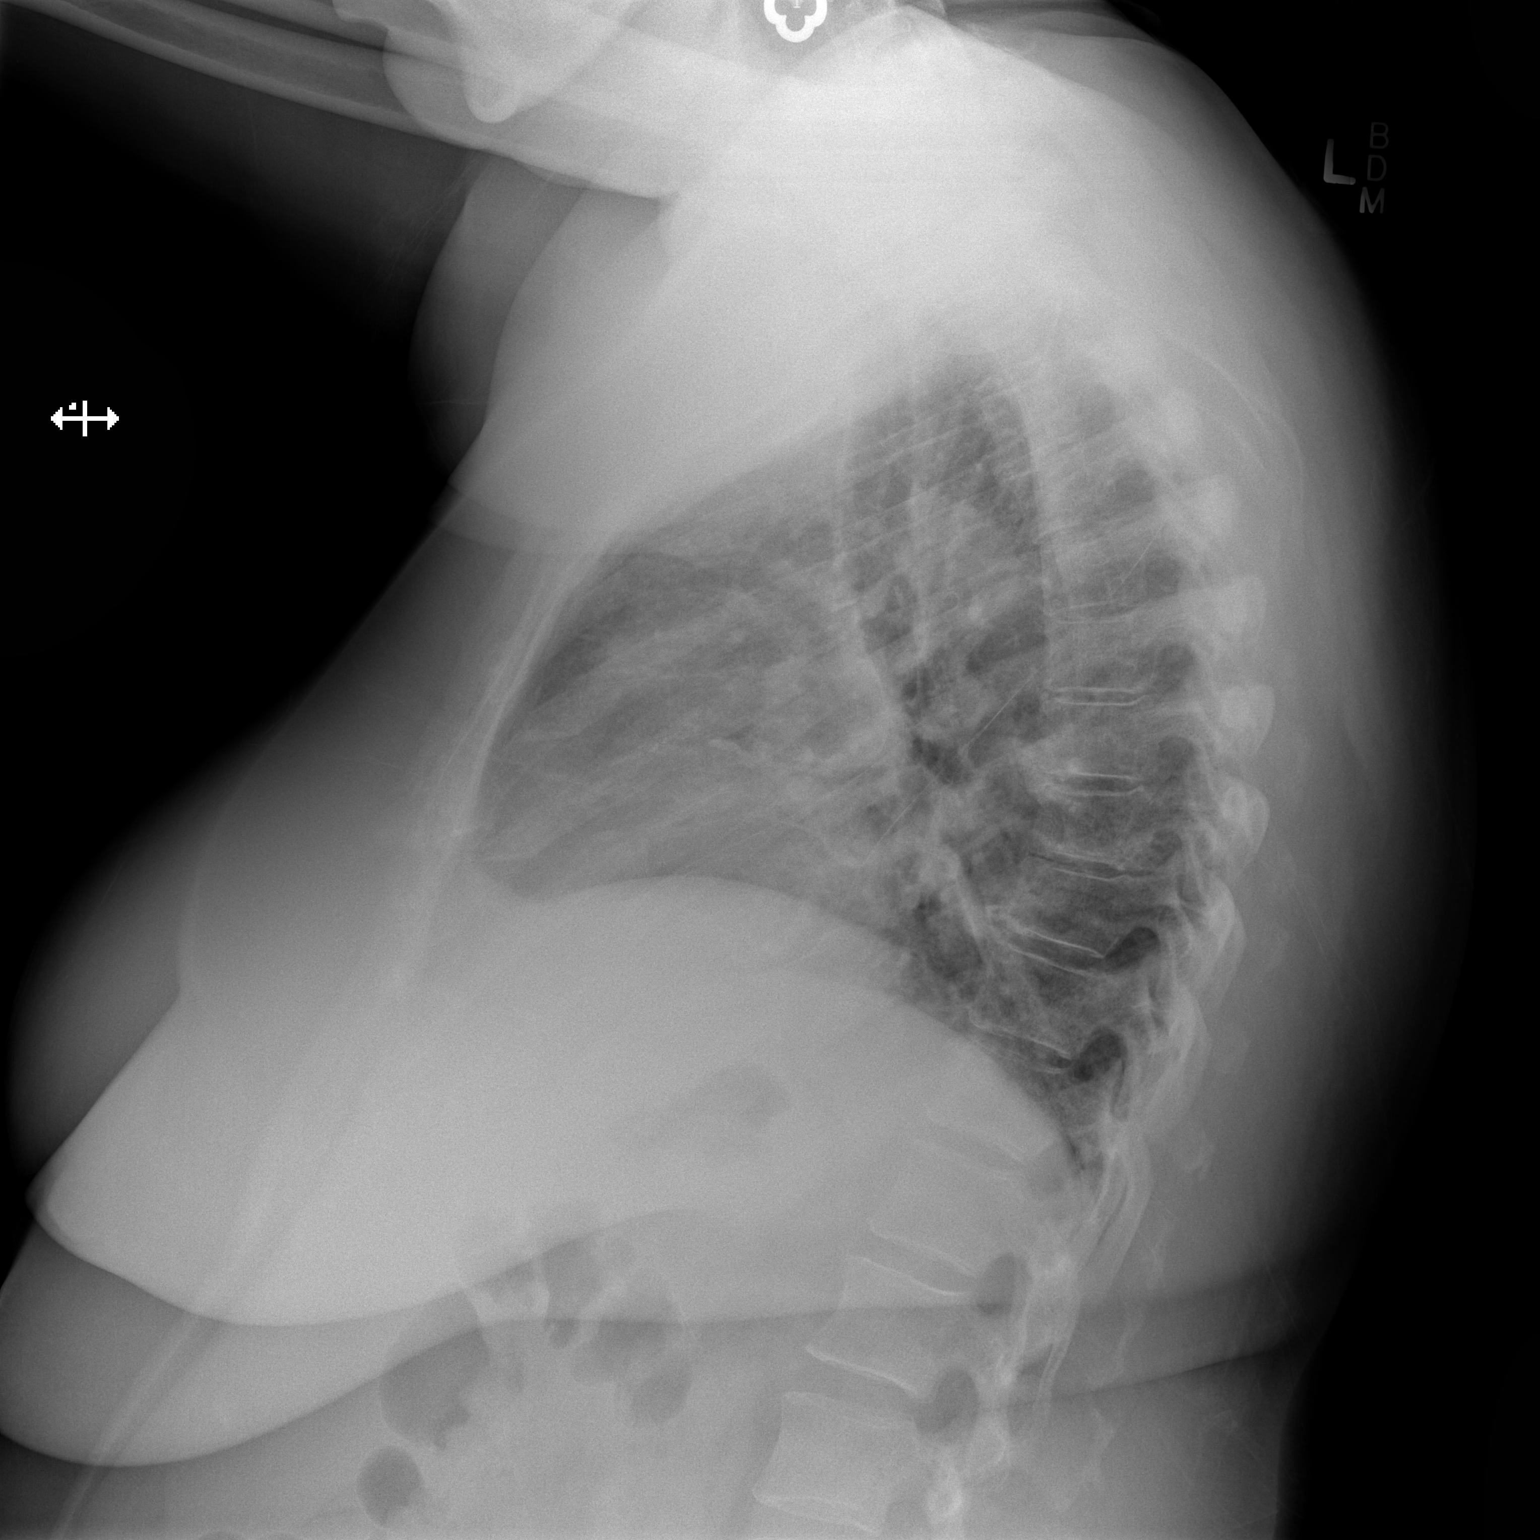

[2 of 2 positions shown; findings below may reference images not displayed]

FINDINGS: The heart size and mediastinal contours are within normal limits.
Both lungs are clear. The visualized skeletal structures are
unremarkable.
IMPRESSION: No active cardiopulmonary disease.

## 2018-10-26 ENCOUNTER — Other Ambulatory Visit: Payer: Self-pay

## 2018-10-28 ENCOUNTER — Ambulatory Visit (INDEPENDENT_AMBULATORY_CARE_PROVIDER_SITE_OTHER): Payer: Medicare PPO | Admitting: Endocrinology

## 2018-10-28 ENCOUNTER — Encounter: Payer: Self-pay | Admitting: Endocrinology

## 2018-10-28 ENCOUNTER — Other Ambulatory Visit: Payer: Self-pay

## 2018-10-28 VITALS — BP 142/80 | HR 77 | Ht 66.0 in | Wt 230.2 lb

## 2018-10-28 DIAGNOSIS — E119 Type 2 diabetes mellitus without complications: Secondary | ICD-10-CM

## 2018-10-28 LAB — POCT GLYCOSYLATED HEMOGLOBIN (HGB A1C): Hemoglobin A1C: 7.5 % — AB (ref 4.0–5.6)

## 2018-10-28 MED ORDER — LANTUS SOLOSTAR 100 UNIT/ML ~~LOC~~ SOPN: 20.0000 [IU] | PEN_INJECTOR | Freq: Two times a day (BID) | SUBCUTANEOUS | 0 refills | Status: DC

## 2018-10-28 MED ORDER — NOVOLOG FLEXPEN 100 UNIT/ML ~~LOC~~ SOPN: PEN_INJECTOR | SUBCUTANEOUS | 11 refills | Status: DC

## 2018-10-28 NOTE — Patient Instructions (Addendum)
Your blood pressure is high today.  Please see your primary care provider soon, to have it rechecked good diet and exercise significantly improve the control of your diabetes.  please let me know if you wish to be referred to a dietician.  high blood sugar is very risky to your health.  you should see an eye doctor and dentist every year.  It is very important to get all recommended vaccinations.  Controlling your blood pressure and cholesterol drastically reduces the damage diabetes does to your body.  Those who smoke should quit.  Please discuss these with your doctor.  check your blood sugar twice a day.  vary the time of day when you check, between before the 3 meals, and at bedtime.  also check if you have symptoms of your blood sugar being too high or too low.  please keep a record of the readings and bring it to your next appointment here (or you can bring the meter itself).  You can write it on any piece of paper.  please call us sooner if your blood sugar goes below 70, or if you have a lot of readings over 200.  Please continue the same metformin, and:  Change the insulins to the numbers listed below.  Please have a follow-up appointment in 1 month, by video if you prefer.       Diabetes Mellitus and Nutrition, Adult When you have diabetes (diabetes mellitus), it is very important to have healthy eating habits because your blood sugar (glucose) levels are greatly affected by what you eat and drink. Eating healthy foods in the appropriate amounts, at about the same times every day, can help you:  Control your blood glucose.  Lower your risk of heart disease.  Improve your blood pressure.  Reach or maintain a healthy weight. Every person with diabetes is different, and each person has different needs for a meal plan. Your health care provider may recommend that you work with a diet and nutrition specialist (dietitian) to make a meal plan that is best for you. Your meal plan may vary  depending on factors such as:  The calories you need.  The medicines you take.  Your weight.  Your blood glucose, blood pressure, and cholesterol levels.  Your activity level.  Other health conditions you have, such as heart or kidney disease. How do carbohydrates affect me? Carbohydrates, also called carbs, affect your blood glucose level more than any other type of food. Eating carbs naturally raises the amount of glucose in your blood. Carb counting is a method for keeping track of how many carbs you eat. Counting carbs is important to keep your blood glucose at a healthy level, especially if you use insulin or take certain oral diabetes medicines. It is important to know how many carbs you can safely have in each meal. This is different for every person. Your dietitian can help you calculate how many carbs you should have at each meal and for each snack. Foods that contain carbs include:  Bread, cereal, rice, pasta, and crackers.  Potatoes and corn.  Peas, beans, and lentils.  Milk and yogurt.  Fruit and juice.  Desserts, such as cakes, cookies, ice cream, and candy. How does alcohol affect me? Alcohol can cause a sudden decrease in blood glucose (hypoglycemia), especially if you use insulin or take certain oral diabetes medicines. Hypoglycemia can be a life-threatening condition. Symptoms of hypoglycemia (sleepiness, dizziness, and confusion) are similar to symptoms of having too much alcohol.  If your health care provider says that alcohol is safe for you, follow these guidelines:  Limit alcohol intake to no more than 1 drink per day for nonpregnant women and 2 drinks per day for men. One drink equals 12 oz of beer, 5 oz of wine, or 1 oz of hard liquor.  Do not drink on an empty stomach.  Keep yourself hydrated with water, diet soda, or unsweetened iced tea.  Keep in mind that regular soda, juice, and other mixers may contain a lot of sugar and must be counted as  carbs. What are tips for following this plan?  Reading food labels  Start by checking the serving size on the "Nutrition Facts" label of packaged foods and drinks. The amount of calories, carbs, fats, and other nutrients listed on the label is based on one serving of the item. Many items contain more than one serving per package.  Check the total grams (g) of carbs in one serving. You can calculate the number of servings of carbs in one serving by dividing the total carbs by 15. For example, if a food has 30 g of total carbs, it would be equal to 2 servings of carbs.  Check the number of grams (g) of saturated and trans fats in one serving. Choose foods that have low or no amount of these fats.  Check the number of milligrams (mg) of salt (sodium) in one serving. Most people should limit total sodium intake to less than 2,300 mg per day.  Always check the nutrition information of foods labeled as "low-fat" or "nonfat". These foods may be higher in added sugar or refined carbs and should be avoided.  Talk to your dietitian to identify your daily goals for nutrients listed on the label. Shopping  Avoid buying canned, premade, or processed foods. These foods tend to be high in fat, sodium, and added sugar.  Shop around the outside edge of the grocery store. This includes fresh fruits and vegetables, bulk grains, fresh meats, and fresh dairy. Cooking  Use low-heat cooking methods, such as baking, instead of high-heat cooking methods like deep frying.  Cook using healthy oils, such as olive, canola, or sunflower oil.  Avoid cooking with butter, cream, or high-fat meats. Meal planning  Eat meals and snacks regularly, preferably at the same times every day. Avoid going long periods of time without eating.  Eat foods high in fiber, such as fresh fruits, vegetables, beans, and whole grains. Talk to your dietitian about how many servings of carbs you can eat at each meal.  Eat 4-6 ounces (oz)  of lean protein each day, such as lean meat, chicken, fish, eggs, or tofu. One oz of lean protein is equal to: ? 1 oz of meat, chicken, or fish. ? 1 egg. ?  cup of tofu.  Eat some foods each day that contain healthy fats, such as avocado, nuts, seeds, and fish. Lifestyle  Check your blood glucose regularly.  Exercise regularly as told by your health care provider. This may include: ? 150 minutes of moderate-intensity or vigorous-intensity exercise each week. This could be brisk walking, biking, or water aerobics. ? Stretching and doing strength exercises, such as yoga or weightlifting, at least 2 times a week.  Take medicines as told by your health care provider.  Do not use any products that contain nicotine or tobacco, such as cigarettes and e-cigarettes. If you need help quitting, ask your health care provider.  Work with a Social worker or diabetes educator  to identify strategies to manage stress and any emotional and social challenges. Questions to ask a health care provider  Do I need to meet with a diabetes educator?  Do I need to meet with a dietitian?  What number can I call if I have questions?  When are the best times to check my blood glucose? Where to find more information:  American Diabetes Association: diabetes.org  Academy of Nutrition and Dietetics: www.eatright.[employer] of Diabetes and Digestive and Kidney Diseases (NIH): CarFlippers.tn Summary  A healthy meal plan will help you control your blood glucose and maintain a healthy lifestyle.  Working with a diet and nutrition specialist (dietitian) can help you make a meal plan that is best for you.  Keep in mind that carbohydrates (carbs) and alcohol have immediate effects on your blood glucose levels. It is important to count carbs and to use alcohol carefully. This information is not intended to replace advice given to you by your health care provider. Make sure you discuss any questions you  have with your health care provider. Document Released: 09/27/2004 Document Revised: 12/13/2016 Document Reviewed: 02/05/2016 Elsevier Patient Education  2020 [employer].

## 2018-10-28 NOTE — Progress Notes (Signed)
Subjective:    Patient ID: Anne Cox, female    DOB: December 25, 1950, 68 y.o.   MRN: [id number]  HPI pt is referred by Dr Mora Appl, for diabetes.  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  She had GDM in 1972.  She takes Lantus 25-BID, PRN Novolog (averages approx 10 units/d), and metformin.  she brings a record of her cbg's which I have reviewed today.  cbg varies from 90-307.  It is in general higher as the day goes on.  She says she cannot afford to continue taking Lantus.  Pt says she knows how to use syringe and vial.   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   Procedure: TOTAL KNEE ARTHROPLASTY;  Surgeon: Dannielle Huh, MD;  Location: [facility] OR;  Service: Orthopedics;  Laterality: Left;    Social History   Socioeconomic History   Marital status: Married    Spouse name: Not on file   Number of children: Not on file   Years of education: Not on file   Highest education level: Not on file  Occupational History   Not on file  Social Needs   Financial resource strain: Not on file   Food insecurity    Worry: Not on file    Inability: Not on file   Transportation needs    Medical: Not on file    Non-medical: Not on file  Tobacco Use   Smoking status: Never Smoker    Smokeless tobacco: Never Used  Substance and Sexual Activity   Alcohol use: No   Drug use: No   Sexual activity: Not Currently  Lifestyle   Physical activity    Days per week: Not on file    Minutes per session: Not on file   Stress: Not on file  Relationships   Social connections    Talks on phone: Not on file    Gets together: Not on file    Attends religious service: Not on file    Active member of club or organization: Not on file    Attends meetings of clubs or organizations: Not on file    Relationship status: Not on file   Intimate partner violence    Fear of current or ex partner: Not on file    Emotionally abused: Not on file    Physically abused: Not on file    Forced sexual activity: Not on file  Other Topics Concern   Not on file  Social History Narrative   Not on file    Current Outpatient Medications on File Prior to Visit  Medication Sig Dispense Refill   amLODipine (NORVASC) 10 MG tablet Take 10  mg by mouth daily.     atorvastatin (LIPITOR) 20 MG tablet Take 20 mg by mouth daily.     Canagliflozin (INVOKANA) 300 MG TABS Take 300 mg by mouth daily.     cetirizine (ZYRTEC) 10 MG tablet Take 10 mg by mouth daily.     enoxaparin (LOVENOX) 40 MG/0.4ML injection Inject 0.4 mLs (40 mg total) into the skin daily. 12 Syringe 0   esomeprazole (NEXIUM) 40 MG capsule Take 40 mg by mouth daily as needed (for reflux).     Insulin Pen Needle (BD PEN NEEDLE NANO U/F) 32G X 4 MM MISC 1 each by Other route 3 (three) times daily.     losartan-hydrochlorothiazide (HYZAAR) 100-25 MG per tablet Take 1 tablet by mouth daily.     metFORMIN (GLUCOPHAGE) 500 MG tablet Take 1,000 mg by mouth 2 (two) times daily.     methocarbamol (ROBAXIN) 500 MG tablet Take 1-2 tablets (500-1,000 mg total) by mouth every 6 (six) hours as needed for muscle spasms. 60 tablet 2   metoprolol succinate (TOPROL-XL) 50 MG 24 hr tablet Take 50 mg by mouth 2 (two) times daily. Take with or  immediately following a meal.     mometasone (NASONEX) 50 MCG/ACT nasal spray Place 2 sprays into the nose daily.     Multiple Vitamins-Minerals (MULTIVITAMIN WITH MINERALS) tablet Take 1 tablet by mouth daily.     oxyCODONE (OXY IR/ROXICODONE) 5 MG immediate release tablet Take 1-2 tablets (5-10 mg total) by mouth every 4 (four) hours as needed for breakthrough pain. 90 tablet 0   OxyCODONE (OXYCONTIN) 10 mg T12A 12 hr tablet Take 1 tablet (10 mg total) by mouth every 12 (twelve) hours. 30 tablet 0   Tavaborole (KERYDIN) 5 % SOLN Apply 1 application topically daily.     terbinafine (LAMISIL) 250 MG tablet Take 250 mg by mouth daily.     No current facility-administered medications on file prior to visit.     No Known Allergies  Family History  Problem Relation Age of Onset   Diabetes Mother    Diabetes Son     BP (!) 142/80 (BP Location: Left Arm, Patient Position: Sitting, Cuff Size: Large)    Pulse 77    Ht  (1.676 m)    Wt 230 lb 3.2 oz (104.4 kg)    SpO2 97%    BMI 37.16 kg/m    Review of Systems denies blurry vision, headache, chest pain, sob, n/v, urinary frequency, muscle cramps, excessive diaphoresis, memory loss, depression, cold intolerance, rhinorrhea, and easy bruising.  No recent change in left hand swelling.  She has lost a few lbs since CVA     Objective:   Physical Exam VS: see vs page GEN: no distress HEAD: head: no deformity eyes: no periorbital swelling, no proptosis external nose and ears are normal NECK: supple, thyroid is not enlarged CHEST WALL: no deformity LUNGS: clear to auscultation CV: reg rate and rhythm, no murmur ABD: abdomen is soft, nontender.  no hepatosplenomegaly.  not distended.  no hernia MUSCULOSKELETAL: muscle bulk and strength are grossly normal, except decreased strength of the LUE.  no obvious joint swelling.  gait is normal and steady.   EXTEMITIES: no deformity.  no ulcer on the feet.  feet are of normal color and  temp.  slight edema of the left hand, and 1+ on both legs.  there is bilateral onychomycosis of the toenails.    PULSES: dorsalis pedis intact bilat.  no carotid bruit  NEURO:  cn 2-12 grossly intact.   readily moves all 4's.  sensation is intact to touch on the feet SKIN:  Normal texture and temperature.  No rash or suspicious lesion is visible.   NODES:  None palpable at the neck PSYCH: alert, well-oriented.  Does not appear anxious nor depressed.  Lab Results  Component Value Date   HGBA1C 7.5 (A) 10/28/2018    I have reviewed outside records, and summarized: Pt was noted to have elevated a1c, and referred here.  She recently had TKR, and glycemic control was good then.        Assessment & Plan:  Insulin-requiring type 2 DM, with DR, new to me: Based on the pattern of her cbg's, she needs some adjustment in her therapy.   HTN: is noted today.     Patient Instructions  Your blood pressure is high today.  Please see your primary care provider soon, to have it rechecked good diet and exercise significantly improve the control of your diabetes.  please let me know if you wish to be referred to a dietician.  high blood sugar is very risky to your health.  you should see an eye doctor and dentist every year.  It is very important to get all recommended vaccinations.  Controlling your blood pressure and cholesterol drastically reduces the damage diabetes does to your body.  Those who smoke should quit.  Please discuss these with your doctor.  check your blood sugar twice a day.  vary the time of day when you check, between before the 3 meals, and at bedtime.  also check if you have symptoms of your blood sugar being too high or too low.  please keep a record of the readings and bring it to your next appointment here (or you can bring the meter itself).  You can write it on any piece of paper.  please call us sooner if your blood sugar goes below 70, or if you have a lot of readings over 200.    Please continue the same metformin, and:  Change the insulins to the numbers listed below.  Please have a follow-up appointment in 1 month, by video if you prefer.       Diabetes Mellitus and Nutrition, Adult When you have diabetes (diabetes mellitus), it is very important to have healthy eating habits because your blood sugar (glucose) levels are greatly affected by what you eat and drink. Eating healthy foods in the appropriate amounts, at about the same times every day, can help you:  Control your blood glucose.  Lower your risk of heart disease.  Improve your blood pressure.  Reach or maintain a healthy weight. Every person with diabetes is different, and each person has different needs for a meal plan. Your health care provider may recommend that you work with a diet and nutrition specialist (dietitian) to make a meal plan that is best for you. Your meal plan may vary depending on factors such as:  The calories you need.  The medicines you take.  Your weight.  Your blood glucose, blood pressure, and cholesterol levels.  Your activity level.  Other health conditions you have, such as heart or kidney disease. How do carbohydrates affect me? Carbohydrates, also called carbs, affect your blood glucose level more than any other type of food. Eating carbs naturally raises the amount of glucose in your blood. Carb counting is a method for keeping track of how many carbs you eat. Counting carbs is important to  keep your blood glucose at a healthy level, especially if you use insulin or take certain oral diabetes medicines. It is important to know how many carbs you can safely have in each meal. This is different for every person. Your dietitian can help you calculate how many carbs you should have at each meal and for each snack. Foods that contain carbs include:  Bread, cereal, rice, pasta, and crackers.  Potatoes and corn.  Peas, beans, and lentils.  Milk and  yogurt.  Fruit and juice.  Desserts, such as cakes, cookies, ice cream, and candy. How does alcohol affect me? Alcohol can cause a sudden decrease in blood glucose (hypoglycemia), especially if you use insulin or take certain oral diabetes medicines. Hypoglycemia can be a life-threatening condition. Symptoms of hypoglycemia (sleepiness, dizziness, and confusion) are similar to symptoms of having too much alcohol. If your health care provider says that alcohol is safe for you, follow these guidelines:  Limit alcohol intake to no more than 1 drink per day for nonpregnant women and 2 drinks per day for men. One drink equals 12 oz of beer, 5 oz of wine, or 1 oz of hard liquor.  Do not drink on an empty stomach.  Keep yourself hydrated with water, diet soda, or unsweetened iced tea.  Keep in mind that regular soda, juice, and other mixers may contain a lot of sugar and must be counted as carbs. What are tips for following this plan?  Reading food labels  Start by checking the serving size on the "Nutrition Facts" label of packaged foods and drinks. The amount of calories, carbs, fats, and other nutrients listed on the label is based on one serving of the item. Many items contain more than one serving per package.  Check the total grams (g) of carbs in one serving. You can calculate the number of servings of carbs in one serving by dividing the total carbs by 15. For example, if a food has 30 g of total carbs, it would be equal to 2 servings of carbs.  Check the number of grams (g) of saturated and trans fats in one serving. Choose foods that have low or no amount of these fats.  Check the number of milligrams (mg) of salt (sodium) in one serving. Most people should limit total sodium intake to less than 2,300 mg per day.  Always check the nutrition information of foods labeled as "low-fat" or "nonfat". These foods may be higher in added sugar or refined carbs and should be avoided.  Talk to  your dietitian to identify your daily goals for nutrients listed on the label. Shopping  Avoid buying canned, premade, or processed foods. These foods tend to be high in fat, sodium, and added sugar.  Shop around the outside edge of the grocery store. This includes fresh fruits and vegetables, bulk grains, fresh meats, and fresh dairy. Cooking  Use low-heat cooking methods, such as baking, instead of high-heat cooking methods like deep frying.  Cook using healthy oils, such as olive, canola, or sunflower oil.  Avoid cooking with butter, cream, or high-fat meats. Meal planning  Eat meals and snacks regularly, preferably at the same times every day. Avoid going long periods of time without eating.  Eat foods high in fiber, such as fresh fruits, vegetables, beans, and whole grains. Talk to your dietitian about how many servings of carbs you can eat at each meal.  Eat 4-6 ounces (oz) of lean protein each day, such as lean meat,  chicken, fish, eggs, or tofu. One oz of lean protein is equal to: ? 1 oz of meat, chicken, or fish. ? 1 egg. ?  cup of tofu.  Eat some foods each day that contain healthy fats, such as avocado, nuts, seeds, and fish. Lifestyle  Check your blood glucose regularly.  Exercise regularly as told by your health care provider. This may include: ? 150 minutes of moderate-intensity or vigorous-intensity exercise each week. This could be brisk walking, biking, or water aerobics. ? Stretching and doing strength exercises, such as yoga or weightlifting, at least 2 times a week.  Take medicines as told by your health care provider.  Do not use any products that contain nicotine or tobacco, such as cigarettes and e-cigarettes. If you need help quitting, ask your health care provider.  Work with a Veterinary surgeon or diabetes educator to identify strategies to manage stress and any emotional and social challenges. Questions to ask a health care provider  Do I need to meet with  a diabetes educator?  Do I need to meet with a dietitian?  What number can I call if I have questions?  When are the best times to check my blood glucose? Where to find more information:  American Diabetes Association: diabetes.org  Academy of Nutrition and Dietetics: www.eatright.[employer] of Diabetes and Digestive and Kidney Diseases (NIH): CarFlippers.tn Summary  A healthy meal plan will help you control your blood glucose and maintain a healthy lifestyle.  Working with a diet and nutrition specialist (dietitian) can help you make a meal plan that is best for you.  Keep in mind that carbohydrates (carbs) and alcohol have immediate effects on your blood glucose levels. It is important to count carbs and to use alcohol carefully. This information is not intended to replace advice given to you by your health care provider. Make sure you discuss any questions you have with your health care provider. Document Released: 09/27/2004 Document Revised: 12/13/2016 Document Reviewed: 02/05/2016 Elsevier Patient Education  2020 [employer].

## 2018-11-30 ENCOUNTER — Other Ambulatory Visit: Payer: Self-pay

## 2018-12-02 ENCOUNTER — Ambulatory Visit: Payer: Medicare PPO | Admitting: Endocrinology

## 2018-12-07 ENCOUNTER — Other Ambulatory Visit: Payer: Self-pay

## 2018-12-07 ENCOUNTER — Encounter: Payer: Self-pay | Admitting: Endocrinology

## 2018-12-07 ENCOUNTER — Ambulatory Visit (INDEPENDENT_AMBULATORY_CARE_PROVIDER_SITE_OTHER): Payer: Medicare PPO | Admitting: Endocrinology

## 2018-12-07 DIAGNOSIS — Z794 Long term (current) use of insulin: Secondary | ICD-10-CM | POA: Diagnosis not present

## 2018-12-07 DIAGNOSIS — E1159 Type 2 diabetes mellitus with other circulatory complications: Secondary | ICD-10-CM

## 2018-12-07 MED ORDER — LANTUS SOLOSTAR 100 UNIT/ML ~~LOC~~ SOPN: 19.0000 [IU] | PEN_INJECTOR | Freq: Two times a day (BID) | SUBCUTANEOUS | 0 refills | Status: DC

## 2018-12-07 MED ORDER — NOVOLOG FLEXPEN 100 UNIT/ML ~~LOC~~ SOPN: PEN_INJECTOR | SUBCUTANEOUS | 11 refills | Status: DC

## 2018-12-07 NOTE — Progress Notes (Signed)
Subjective:    Patient ID: Anne Cox, female    DOB: 07-Feb-1950, 68 y.o.   MRN: [id number]  HPI Pt returns for f/u of diabetes mellitus: DM type: Insulin-requiring type 2. Dx'ed: 1996 Complications: DR, CVA, and PN Therapy: insulin since 1997 GDM: 1972 DKA: never Severe hypoglycemia: never Pancreatitis: never Pancreatic imaging: never Other: she takes multiple daily injections Interval history: pt states she feels well in general.  she brings a record of her cbg's which I have reviewed today.  cbg's vary from 68-215.  It is in general higher as the day goes on.  Past Medical History:  Diagnosis Date  . Arthritis   . Asthma   . Cerebral aneurysm    1984   RESOLVED  . Diabetes mellitus without complication (HCC)   . Dysrhythmia    IRREG HEARTBEAT  . GERD (gastroesophageal reflux disease)   . Hypertension   . Stroke [facility])    1980    Past Surgical History:  Procedure Laterality Date  . ABDOMINAL HYSTERECTOMY    . CATARACT EXTRACTION W/ INTRAOCULAR LENS  IMPLANT, BILATERAL    . CESAREAN SECTION    . RETINAL LASER PROCEDURE     BILAT  . 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 Marital status: Married    Spouse name: Not on file  . Number of children: Not on file  . Years of education: Not on file  . Highest education level: Not on file  Occupational History  . Not on file  Social Needs  . Financial resource strain: Not on file  . Food insecurity    Worry: Not on file    Inability: Not on file  . Transportation needs    Medical: Not on file    Non-medical: Not on file  Tobacco Use  . Smoking status: Never Smoker  . Smokeless tobacco: Never Used  Substance and Sexual Activity  . Alcohol use: No  . Drug use: No  . Sexual activity: Not Currently  Lifestyle  . Physical activity    Days  per week: Not on file    Minutes per session: Not on file  . Stress: Not on file  Relationships  . Social Musician on phone: Not on file    Gets together: Not on file    Attends religious service: Not on file    Active member of club or organization: Not on file    Attends meetings of clubs or organizations: Not on file    Relationship status: Not on file  . Intimate partner violence    Fear of current or ex partner: Not on file    Emotionally abused: Not on file    Physically abused: Not on file    Forced sexual activity: Not on file  Other Topics Concern  . Not on file  Social History Narrative  . Not on file    Current Outpatient Medications on File Prior to Visit  Medication Sig Dispense Refill  . amLODipine (NORVASC) 10 MG tablet Take 10 mg by mouth daily.    Marland Kitchen atorvastatin (LIPITOR) 20 MG tablet Take 20 mg by mouth daily.    . Canagliflozin (INVOKANA) 300 MG TABS Take 300 mg by mouth daily.    . cetirizine (ZYRTEC) 10 MG tablet Take 10 mg by  mouth daily.    Marland Kitchen enoxaparin (LOVENOX) 40 MG/0.4ML injection Inject 0.4 mLs (40 mg total) into the skin daily. 12 Syringe 0  . esomeprazole (NEXIUM) 40 MG capsule Take 40 mg by mouth daily as needed (for reflux).    . Insulin Pen Needle (BD PEN NEEDLE NANO U/F) 32G X 4 MM MISC 1 each by Other route 3 (three) times daily.    Marland Kitchen losartan-hydrochlorothiazide (HYZAAR) 100-25 MG per tablet Take 1 tablet by mouth daily.    . metFORMIN (GLUCOPHAGE) 500 MG tablet Take 1,000 mg by mouth 2 (two) times daily.    . methocarbamol (ROBAXIN) 500 MG tablet Take 1-2 tablets (500-1,000 mg total) by mouth every 6 (six) hours as needed for muscle spasms. 60 tablet 2  . metoprolol succinate (TOPROL-XL) 50 MG 24 hr tablet Take 50 mg by mouth 2 (two) times daily. Take with or immediately following a meal.    . mometasone (NASONEX) 50 MCG/ACT nasal spray Place 2 sprays into the nose daily.    . Multiple Vitamins-Minerals (MULTIVITAMIN WITH MINERALS)  tablet Take 1 tablet by mouth daily.    Marland Kitchen oxyCODONE (OXY IR/ROXICODONE) 5 MG immediate release tablet Take 1-2 tablets (5-10 mg total) by mouth every 4 (four) hours as needed for breakthrough pain. 90 tablet 0  . OxyCODONE (OXYCONTIN) 10 mg T12A 12 hr tablet Take 1 tablet (10 mg total) by mouth every 12 (twelve) hours. 30 tablet 0  . Tavaborole (KERYDIN) 5 % SOLN Apply 1 application topically daily.    Marland Kitchen terbinafine (LAMISIL) 250 MG tablet Take 250 mg by mouth daily.     No current facility-administered medications on file prior to visit.     No Known Allergies  Family History  Problem Relation Age of Onset  . Diabetes Mother   . Diabetes Son     BP 132/76 (BP Location: Right Arm, Patient Position: Sitting, Cuff Size: Large)   Pulse 91   Ht 5\' 6"  (1.676 m)   Wt 228 lb 3.2 oz (103.5 kg)   SpO2 98%   BMI 36.83 kg/m   Review of Systems Denies LOC.      Objective:   Physical Exam VITAL SIGNS:  See vs page GENERAL: no distress Pulses: dorsalis pedis intact bilat.   MSK: no deformity of the feet CV: no leg edema Skin:  no ulcer on the feet.  normal color and temp on the feet. Neuro: sensation is intact to touch on the feet      Assessment & Plan:  Insulin-requiring type 2 DM: Based on the pattern of her cbg's, she needs some adjustment in her therapy Hypoglycemia: this limits aggressiveness of glycemic control  Patient Instructions  check your blood sugar twice a day.  vary the time of day when you check, between before the 3 meals, and at bedtime.  also check if you have symptoms of your blood sugar being too high or too low.  please keep a record of the readings and bring it to your next appointment here (or you can bring the meter itself).  You can write it on any piece of paper.  please call us sooner if your blood sugar goes below 70, or if you have a lot of readings over 200.  Please continue the same metformin, and:  Change the insulins to the numbers listed below.   Please have a follow-up appointment in January.

## 2018-12-07 NOTE — Patient Instructions (Addendum)
check your blood sugar twice a day.  vary the time of day when you check, between before the 3 meals, and at bedtime.  also check if you have symptoms of your blood sugar being too high or too low.  please keep a record of the readings and bring it to your next appointment here (or you can bring the meter itself).  You can write it on any piece of paper.  please call us sooner if your blood sugar goes below 70, or if you have a lot of readings over 200.  Please continue the same metformin, and:  Change the insulins to the numbers listed below.  Please have a follow-up appointment in January.

## 2018-12-10 DIAGNOSIS — E119 Type 2 diabetes mellitus without complications: Secondary | ICD-10-CM | POA: Insufficient documentation

## 2019-01-20 ENCOUNTER — Encounter: Payer: Self-pay | Admitting: Endocrinology

## 2019-01-20 ENCOUNTER — Ambulatory Visit (INDEPENDENT_AMBULATORY_CARE_PROVIDER_SITE_OTHER): Payer: Medicare PPO | Admitting: Endocrinology

## 2019-01-20 ENCOUNTER — Other Ambulatory Visit: Payer: Self-pay

## 2019-01-20 VITALS — BP 128/66 | HR 100 | Ht 66.0 in | Wt 226.2 lb

## 2019-01-20 DIAGNOSIS — Z794 Long term (current) use of insulin: Secondary | ICD-10-CM

## 2019-01-20 DIAGNOSIS — E1159 Type 2 diabetes mellitus with other circulatory complications: Secondary | ICD-10-CM

## 2019-01-20 LAB — POCT GLYCOSYLATED HEMOGLOBIN (HGB A1C): Hemoglobin A1C: 8.6 % — AB (ref 4.0–5.6)

## 2019-01-20 MED ORDER — INSULIN NPH (HUMAN) (ISOPHANE) 100 UNIT/ML ~~LOC~~ SUSP: 15.0000 [IU] | Freq: Every day | SUBCUTANEOUS | 11 refills | Status: DC

## 2019-01-20 MED ORDER — INSULIN REGULAR HUMAN 100 UNIT/ML IJ SOLN: INTRAMUSCULAR | 11 refills | Status: DC

## 2019-01-20 NOTE — Progress Notes (Signed)
Subjective:    Patient ID: Anne KEEVEN, female    DOB: 1950-03-28, 69 y.o.   MRN: [id number]  HPI Pt returns for f/u of diabetes mellitus: DM type: Insulin-requiring type 2. 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  she brings a record of her cbg's which I have reviewed today.  cbg's vary from 89-247.  It is in general higher as the day goes on. No recent steroids.  Pt says she never misses insulin doses.  She can no longer afford brand name meds.  Past Medical History:  Diagnosis Date  . Arthritis   . Asthma   . Cerebral aneurysm    1984   RESOLVED  . Diabetes mellitus without complication ([place])   . Dysrhythmia    IRREG HEARTBEAT  . GERD (gastroesophageal reflux disease)   . Hypertension   . Stroke [facility])    1980    Past Surgical History:  Procedure Laterality Date  . ABDOMINAL HYSTERECTOMY    . CATARACT EXTRACTION W/ INTRAOCULAR LENS  IMPLANT, BILATERAL    . CESAREAN SECTION    . RETINAL LASER PROCEDURE     BILAT  . 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 Marital status: Married    Spouse name: Not on file  . Number of children: Not on file  . Years of education: Not on file  . Highest education level: Not on file  Occupational History  . Not on file  Tobacco Use  . Smoking status: Never Smoker  . Smokeless tobacco: Never Used  Substance and Sexual Activity  . Alcohol use: No  . Drug use: No  . Sexual activity: Not Currently  Other Topics Concern  . Not on file  Social History Narrative  . Not on file   Social Determinants of Health   Financial  Resource Strain:   . Difficulty of Paying Living Expenses: Not on file  Food Insecurity:   . Worried About Charity fundraiser in the Last Year: Not on file  . Ran Out of Food in the Last Year: Not on file  Transportation Needs:   . Lack of Transportation (Medical): Not on file  . Lack of Transportation (Non-Medical): Not on file  Physical Activity:   . Days of Exercise per Week: Not on file  . Minutes of Exercise per Session: Not on file  Stress:   . Feeling of Stress : Not on file  Social Connections:   . Frequency of Communication with Friends and Family: Not on file  . Frequency of Social Gatherings with Friends and Family: Not on file  . Attends Religious Services: Not on file  . Active Member of Clubs or Organizations: Not on file  . Attends Archivist Meetings: Not on file  . Marital Status: Not on file  Intimate Partner Violence:   . Fear of Current or Ex-Partner: Not on file  . Emotionally Abused: Not on file  . Physically Abused: Not on file  . Sexually Abused: Not on file    Current Outpatient Medications on File Prior to Visit  Medication Sig Dispense Refill  . amLODipine (NORVASC)  10 MG tablet Take 10 mg by mouth daily.    Marland Kitchen atorvastatin (LIPITOR) 20 MG tablet Take 20 mg by mouth daily.    . cetirizine (ZYRTEC) 10 MG tablet Take 10 mg by mouth daily.    Marland Kitchen enoxaparin (LOVENOX) 40 MG/0.4ML injection Inject 0.4 mLs (40 mg total) into the skin daily. 12 Syringe 0  . esomeprazole (NEXIUM) 40 MG capsule Take 40 mg by mouth daily as needed (for reflux).    . Insulin Pen Needle (BD PEN NEEDLE NANO U/F) 32G X 4 MM MISC 1 each by Other route 3 (three) times daily.    Marland Kitchen losartan-hydrochlorothiazide (HYZAAR) 100-25 MG per tablet Take 1 tablet by mouth daily.    . metFORMIN (GLUCOPHAGE) 500 MG tablet Take 1,000 mg by mouth 2 (two) times daily.    . methocarbamol (ROBAXIN) 500 MG tablet Take 1-2 tablets (500-1,000 mg total) by mouth every 6 (six) hours as needed for  muscle spasms. 60 tablet 2  . metoprolol succinate (TOPROL-XL) 50 MG 24 hr tablet Take 50 mg by mouth 2 (two) times daily. Take with or immediately following a meal.    . mometasone (NASONEX) 50 MCG/ACT nasal spray Place 2 sprays into the nose daily.    . Multiple Vitamins-Minerals (MULTIVITAMIN WITH MINERALS) tablet Take 1 tablet by mouth daily.    Marland Kitchen oxyCODONE (OXY IR/ROXICODONE) 5 MG immediate release tablet Take 1-2 tablets (5-10 mg total) by mouth every 4 (four) hours as needed for breakthrough pain. 90 tablet 0  . OxyCODONE (OXYCONTIN) 10 mg T12A 12 hr tablet Take 1 tablet (10 mg total) by mouth every 12 (twelve) hours. 30 tablet 0  . Tavaborole (KERYDIN) 5 % SOLN Apply 1 application topically daily.    Marland Kitchen terbinafine (LAMISIL) 250 MG tablet Take 250 mg by mouth daily.     No current facility-administered medications on file prior to visit.    No Known Allergies  Family History  Problem Relation Age of Onset  . Diabetes Mother   . Diabetes Son     BP 128/66 (BP Location: Right Arm, Patient Position: Sitting, Cuff Size: Large)   Pulse 100   Ht 5\' 6"  (1.676 m)   Wt 226 lb 3.2 oz (102.6 kg)   SpO2 93%   BMI 36.51 kg/m    Review of Systems She denies hypoglycemia.     Objective:   Physical Exam VITAL SIGNS:  See vs page GENERAL: no distress Pulses: dorsalis pedis intact bilat.   MSK: no deformity of the feet CV: trace bilat leg edema Skin:  no ulcer on the feet.  normal color and temp on the feet. Neuro: sensation is intact to touch on the feet   Lab Results  Component Value Date   CREATININE 0.56 06/01/2013   BUN 9 06/01/2013   NA 137 06/01/2013   K 3.7 06/01/2013   CL 98 06/01/2013   CO2 28 06/01/2013   Lab Results  Component Value Date   HGBA1C 8.6 (A) 01/20/2019      Assessment & Plan:  Insulin-requiring type 2 DM, with PN: worse.  Edema: This limits rx options.   Patient Instructions  check your blood sugar twice a day.  vary the time of day when you  check, between before the 3 meals, and at bedtime.  also check if you have symptoms of your blood sugar being too high or too low.  please keep a record of the readings and bring it to your next appointment here (or  you can bring the meter itself).  You can write it on any piece of paper.  please call us sooner if your blood sugar goes below 70, or if you have a lot of readings over 200.  Please continue the same metformin, and:  Change the insulins to the types and numbers listed below.  Please have a follow-up appointment in 2 months.

## 2019-01-20 NOTE — Patient Instructions (Addendum)
check your blood sugar twice a day.  vary the time of day when you check, between before the 3 meals, and at bedtime.  also check if you have symptoms of your blood sugar being too high or too low.  please keep a record of the readings and bring it to your next appointment here (or you can bring the meter itself).  You can write it on any piece of paper.  please call us sooner if your blood sugar goes below 70, or if you have a lot of readings over 200.  Please continue the same metformin, and:  Change the insulins to the types and numbers listed below.  Please have a follow-up appointment in 2 months.

## 2019-02-23 ENCOUNTER — Ambulatory Visit: Payer: Medicare PPO | Admitting: Endocrinology

## 2019-03-25 ENCOUNTER — Ambulatory Visit: Payer: Medicare PPO | Admitting: Endocrinology

## 2019-04-02 ENCOUNTER — Other Ambulatory Visit: Payer: Self-pay

## 2019-04-02 ENCOUNTER — Encounter: Payer: Self-pay | Admitting: Endocrinology

## 2019-04-02 ENCOUNTER — Ambulatory Visit: Payer: Medicare PPO | Admitting: Endocrinology

## 2019-04-02 VITALS — BP 126/78 | HR 90 | Ht 66.0 in | Wt 228.0 lb

## 2019-04-02 DIAGNOSIS — Z794 Long term (current) use of insulin: Secondary | ICD-10-CM | POA: Diagnosis not present

## 2019-04-02 DIAGNOSIS — E1159 Type 2 diabetes mellitus with other circulatory complications: Secondary | ICD-10-CM

## 2019-04-02 LAB — POCT GLYCOSYLATED HEMOGLOBIN (HGB A1C): Hemoglobin A1C: 10.3 % — AB (ref 4.0–5.6)

## 2019-04-02 MED ORDER — METFORMIN HCL 500 MG PO TABS: 1000.0000 mg | ORAL_TABLET | Freq: Every day | ORAL | 3 refills | Status: DC

## 2019-04-02 MED ORDER — INSULIN REGULAR HUMAN 100 UNIT/ML IJ SOLN: INTRAMUSCULAR | 11 refills | Status: DC

## 2019-04-02 MED ORDER — INSULIN NPH (HUMAN) (ISOPHANE) 100 UNIT/ML ~~LOC~~ SUSP: 20.0000 [IU] | Freq: Every day | SUBCUTANEOUS | 3 refills | Status: DC

## 2019-04-02 NOTE — Patient Instructions (Addendum)
check your blood sugar twice a day.  vary the time of day when you check, between before the 3 meals, and at bedtime.  also check if you have symptoms of your blood sugar being too high or too low.  please keep a record of the readings and bring it to your next appointment here (or you can bring the meter itself).  You can write it on any piece of paper.  please call us sooner if your blood sugar goes below 70, or if you have a lot of readings over 200.  Please reduce the metformin to the morning only, and:  Change the insulins to the types and numbers listed below.  Please have a follow-up appointment in 2 months.

## 2019-04-02 NOTE — Progress Notes (Signed)
Subjective:    Patient ID: Anne Cox, female    DOB: 02/28/1950, 69 y.o.   MRN: [id number]  HPI Pt returns for f/u of diabetes mellitus: DM type: Insulin-requiring type 2. 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  GDM: 1972 DKA: never Severe hypoglycemia: never Pancreatitis: never Pancreatic imaging: never SDOH: she cannot afford brand name meds Other: she takes multiple daily injections Interval history: pt states she feels well in general.  no cbg record, but states cbg's are in the mid-100's.  It is in general higher as the day goes on.  No recent steroids.  Pt says she seldom misses insulin doses.  Past Medical History:  Diagnosis Date  . Arthritis   . Asthma   . Cerebral aneurysm    1984   RESOLVED  . Diabetes mellitus without complication (HCC)   . Dysrhythmia    IRREG HEARTBEAT  . GERD (gastroesophageal reflux disease)   . Hypertension   . Stroke [facility])    1980    Past Surgical History:  Procedure Laterality Date  . ABDOMINAL HYSTERECTOMY    . CATARACT EXTRACTION W/ INTRAOCULAR LENS  IMPLANT, BILATERAL    . CESAREAN SECTION    . RETINAL LASER PROCEDURE     BILAT  . 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 Marital status: Married    Spouse name: Not on file  . Number of children: Not on file  . Years of education: Not on file  . Highest education level: Not on file  Occupational History  . Not on file  Tobacco Use  . Smoking status: Never Smoker  . Smokeless tobacco: Never Used  Substance and Sexual Activity  . Alcohol use: No  . Drug use: No  . Sexual activity: Not Currently  Other Topics Concern  . Not on file  Social History Narrative  . Not on file   Social Determinants of Health   Financial Resource Strain:   . Difficulty  of Paying Living Expenses:   Food Insecurity:   . Worried About Programme researcher, broadcasting/film/video in the Last Year:   . Barista in the Last Year:   Transportation Needs:   . Freight forwarder (Medical):   Marland Kitchen Lack of Transportation (Non-Medical):   Physical Activity:   . Days of Exercise per Week:   . Minutes of Exercise per Session:   Stress:   . Feeling of Stress :   Social Connections:   . Frequency of Communication with Friends and Family:   . Frequency of Social Gatherings with Friends and Family:   . Attends Religious Services:   . Active Member of Clubs or Organizations:   . Attends Banker Meetings:   Marland Kitchen Marital Status:   Intimate Partner Violence:   . Fear of Current or Ex-Partner:   . Emotionally Abused:   Marland Kitchen Physically Abused:   . Sexually Abused:     Current Outpatient Medications on File Prior to Visit  Medication Sig Dispense Refill  . amLODipine (NORVASC) 10 MG tablet Take 10 mg by mouth daily.    Marland Kitchen atorvastatin (LIPITOR) 20 MG tablet Take 20 mg by mouth daily.    . cetirizine (ZYRTEC) 10 MG tablet Take 10 mg by mouth daily.    Marland Kitchen  enoxaparin (LOVENOX) 40 MG/0.4ML injection Inject 0.4 mLs (40 mg total) into the skin daily. 12 Syringe 0  . esomeprazole (NEXIUM) 40 MG capsule Take 40 mg by mouth daily as needed (for reflux).    . Insulin Pen Needle (BD PEN NEEDLE NANO U/F) 32G X 4 MM MISC 1 each by Other route 3 (three) times daily.    Marland Kitchen losartan-hydrochlorothiazide (HYZAAR) 100-25 MG per tablet Take 1 tablet by mouth daily.    . methocarbamol (ROBAXIN) 500 MG tablet Take 1-2 tablets (500-1,000 mg total) by mouth every 6 (six) hours as needed for muscle spasms. 60 tablet 2  . metoprolol succinate (TOPROL-XL) 50 MG 24 hr tablet Take 50 mg by mouth 2 (two) times daily. Take with or immediately following a meal.    . mometasone (NASONEX) 50 MCG/ACT nasal spray Place 2 sprays into the nose daily.    . Multiple Vitamins-Minerals (MULTIVITAMIN WITH MINERALS) tablet  Take 1 tablet by mouth daily.    Marland Kitchen oxyCODONE (OXY IR/ROXICODONE) 5 MG immediate release tablet Take 1-2 tablets (5-10 mg total) by mouth every 4 (four) hours as needed for breakthrough pain. 90 tablet 0  . OxyCODONE (OXYCONTIN) 10 mg T12A 12 hr tablet Take 1 tablet (10 mg total) by mouth every 12 (twelve) hours. 30 tablet 0  . Tavaborole (KERYDIN) 5 % SOLN Apply 1 application topically daily.    Marland Kitchen terbinafine (LAMISIL) 250 MG tablet Take 250 mg by mouth daily.     No current facility-administered medications on file prior to visit.    No Known Allergies  Family History  Problem Relation Age of Onset  . Diabetes Mother   . Diabetes Son     BP 126/78   Pulse 90   Ht 5\' 6"  (1.676 m)   Wt 228 lb (103.4 kg)   SpO2 97%   BMI 36.80 kg/m    Review of Systems She has diarrhea.  She denies hypoglycemia    Objective:   Physical Exam VITAL SIGNS:  See vs page GENERAL: no distress Pulses: dorsalis pedis intact bilat.   MSK: no deformity of the feet CV: 1+ bilat leg edema Skin:  no ulcer on the feet.  normal color and temp on the feet. Neuro: sensation is intact to touch on the feet Ext: there is bilateral onychomycosis of the toenails  Lab Results  Component Value Date   CREATININE 0.56 06/01/2013   BUN 9 06/01/2013   NA 137 06/01/2013   K 3.7 06/01/2013   CL 98 06/01/2013   CO2 28 06/01/2013   Lab Results  Component Value Date   HGBA1C 10.3 (A) 04/02/2019        Assessment & Plan:  Diarrhea, new, prob due to metformin Insulin-requiring type 2 DM, with PN: she needs increased rx SDOH: This limits rx options.    Patient Instructions  check your blood sugar twice a day.  vary the time of day when you check, between before the 3 meals, and at bedtime.  also check if you have symptoms of your blood sugar being too high or too low.  please keep a record of the readings and bring it to your next appointment here (or you can bring the meter itself).  You can write it on any  piece of paper.  please call 04/04/2019 sooner if your blood sugar goes below 70, or if you have a lot of readings over 200.  Please reduce the metformin to the morning only, and:  Change the insulins  to the types and numbers listed below.  Please have a follow-up appointment in 2 months.

## 2019-04-07 ENCOUNTER — Telehealth: Payer: Self-pay | Admitting: Endocrinology

## 2019-04-07 ENCOUNTER — Other Ambulatory Visit: Payer: Self-pay

## 2019-04-07 DIAGNOSIS — Z794 Long term (current) use of insulin: Secondary | ICD-10-CM

## 2019-04-07 DIAGNOSIS — E1159 Type 2 diabetes mellitus with other circulatory complications: Secondary | ICD-10-CM

## 2019-04-07 MED ORDER — INSULIN NPH (HUMAN) (ISOPHANE) 100 UNIT/ML ~~LOC~~ SUSP: 20.0000 [IU] | Freq: Every day | SUBCUTANEOUS | 3 refills | Status: DC

## 2019-04-07 MED ORDER — INSULIN REGULAR HUMAN 100 UNIT/ML IJ SOLN: INTRAMUSCULAR | 11 refills | Status: DC

## 2019-04-07 NOTE — Telephone Encounter (Signed)
Patient would like a call back from nurse to provide her blood sugar readings she is concerned about how high her readings are right now - call back number is [phone]   MEDICATION: Novolin N and Novolin R   PHARMACY:  [facility] on [address] in [place]  ([place]ing from [place])  IS THIS A 90 DAY SUPPLY :   IS PATIENT OUT OF MEDICATION: almost per patient  IF NOT; HOW MUCH IS LEFT:   LAST APPOINTMENT DATE: @3 /19/2021  NEXT APPOINTMENT DATE:@5 /28/2021  DO WE HAVE YOUR PERMISSION TO LEAVE A DETAILED MESSAGE:  OTHER COMMENTS:    **Let patient know to contact pharmacy at the end of the day to make sure medication is ready. **  ** Please notify patient to allow 48-72 hours to process**  **Encourage patient to contact the pharmacy for refills or they can request refills through [facility]**

## 2019-04-07 NOTE — Telephone Encounter (Signed)
Are you taking any type of steroids? No  Are you sick or becoming sick? No  Is this the first elevated blood sugar reading? No  Have you tried to correct it with insulin? No  Have you been taking/taken today all of the prescribed medications? Yes   Date Time Reading Notes       3/24 AM 185 N/A  3/23 Bedtime 210 Drank hot choclate with splenda  3/23 4pm 340 Ate a hot dog  3/23 AM 233                                        Please advise

## 2019-04-07 NOTE — Telephone Encounter (Signed)
Called pt and informed of new orders. Verbalized acceptance and understanding.  Outpatient Medication Detail   Disp Refills Start End   insulin NPH Human (NOVOLIN N) 100 UNIT/ML injection 20 mL 3 04/07/2019    Sig - Route: Inject 0.2 mLs (20 Units total) into the skin at bedtime. - Subcutaneous   Sent to pharmacy as: insulin NPH Human (NOVOLIN N) 100 UNIT/ML injection   E-Prescribing Status: Receipt confirmed by pharmacy (04/07/2019  3:26 PM EDT)    Outpatient Medication Detail   Disp Refills Start End   insulin regular (NOVOLIN R) 100 units/mL injection 60 mL 11 04/07/2019    Sig: 3 times a day (just before each meal), 22-16-22 units   Sent to pharmacy as: insulin regular (NOVOLIN R) 100 units/mL injection   E-Prescribing Status: Receipt confirmed by pharmacy (04/07/2019  3:26 PM EDT)    Pharmacy  [facility] [address], [place] - [address]  [address] [place] [address]  Phone:  [phone]  Fax:  [phone]  DEA #:  --

## 2019-04-07 NOTE — Telephone Encounter (Signed)
Please increase reg to 3 times a day (just before each meal), 22-16-22 units

## 2019-04-21 ENCOUNTER — Other Ambulatory Visit: Payer: Self-pay

## 2019-04-21 ENCOUNTER — Telehealth: Payer: Self-pay | Admitting: Endocrinology

## 2019-04-21 DIAGNOSIS — Z794 Long term (current) use of insulin: Secondary | ICD-10-CM

## 2019-04-21 DIAGNOSIS — E1159 Type 2 diabetes mellitus with other circulatory complications: Secondary | ICD-10-CM

## 2019-04-21 MED ORDER — INSULIN REGULAR HUMAN 100 UNIT/ML IJ SOLN: INTRAMUSCULAR | 11 refills | Status: DC

## 2019-04-21 NOTE — Telephone Encounter (Signed)
Patient called requesting Anne Cox to give her a call regarding her high sugar levels. Ph# [phone]

## 2019-04-21 NOTE — Telephone Encounter (Signed)
Please increase reg to 3 times a day (just before each meal), 23-17-23 units

## 2019-04-21 NOTE — Telephone Encounter (Signed)
LVM requesting returned call 

## 2019-04-21 NOTE — Telephone Encounter (Signed)
Returned pt call. States she had her annual exam with PCP. A1C = 10.2. PCP advised pt to add NPH with breakfast and lunch but provided no directions. Pt has been taking 4 units NPH @ breakfast and lunch along with R as Rx'd.   Are you taking any type of steroids? No  Are you sick or becoming sick? No  Is this the first elevated blood sugar reading? No  Have you tried to correct it with insulin? Yes  Have you been taking/taken today all of the prescribed medications? Yes  What is your current diabetic insulin or oral medication dosage? See pt self dosing of NPH as above. R remains as Rx'd   Date Time Reading Notes       4/4 10am 201   4/5 10am 149   4/6 10am 135   4/7 10am 124                                        Please advise

## 2019-04-22 NOTE — Telephone Encounter (Signed)
Called [phone] and spoke with with pt. Informed about new orders. Verbalized acceptance and understanding.

## 2019-04-22 NOTE — Telephone Encounter (Signed)
SECOND ATTEMPT: ° °LVM requesting returned call. °

## 2019-05-10 NOTE — Telephone Encounter (Signed)
Please increase NPH to 25 units QHS

## 2019-05-10 NOTE — Telephone Encounter (Signed)
Patient's BS #  04-29-19  247,   04-30-19  173,  05-01-19  142,   05-02-19    162,  05-03-19  192,  05-04-19  196,  05-05-19  175,  05-06-19  189,  05-07-19  141,  05-08-19  159 05-09-19  196,   05-10-19  214  all before breakfast around 10 am.    Patient is worried that her blood sugar are still high even with change in dose of medication.   She has been on the new medication for 2 weeks now.    Please review and advise.   Thank you

## 2019-05-10 NOTE — Telephone Encounter (Signed)
Patient called stating her sugars are still high and requests nurse to give her a call back to please advise. Ph# [phone]

## 2019-05-11 NOTE — Telephone Encounter (Signed)
Patient notified and states understanding to change dose.

## 2019-06-09 ENCOUNTER — Other Ambulatory Visit: Payer: Self-pay

## 2019-06-11 ENCOUNTER — Encounter: Payer: Self-pay | Admitting: Endocrinology

## 2019-06-11 ENCOUNTER — Other Ambulatory Visit: Payer: Self-pay

## 2019-06-11 ENCOUNTER — Ambulatory Visit: Payer: Medicare PPO | Admitting: Endocrinology

## 2019-06-11 VITALS — BP 142/80 | HR 89 | Ht 66.0 in | Wt 233.0 lb

## 2019-06-11 DIAGNOSIS — Z794 Long term (current) use of insulin: Secondary | ICD-10-CM

## 2019-06-11 DIAGNOSIS — E1159 Type 2 diabetes mellitus with other circulatory complications: Secondary | ICD-10-CM | POA: Diagnosis not present

## 2019-06-11 MED ORDER — INSULIN NPH (HUMAN) (ISOPHANE) 100 UNIT/ML ~~LOC~~ SUSP: 75.0000 [IU] | SUBCUTANEOUS | 11 refills | Status: DC

## 2019-06-11 NOTE — Patient Instructions (Addendum)
check your blood sugar twice a day.  vary the time of day when you check, between before the 3 meals, and at bedtime.  also check if you have symptoms of your blood sugar being too high or too low.  please keep a record of the readings and bring it to your next appointment here (or you can bring the meter itself).  You can write it on any piece of paper.  please call us sooner if your blood sugar goes below 70, or if you have a lot of readings over 200.  Please continue the same metformin, and:  Change both insulins to NPH only, 75 units each morning. On this type of insulin schedule, you should eat meals on a regular schedule.  If a meal is missed or significantly delayed, your blood sugar could go low.  Please have a follow-up appointment in 2 months.

## 2019-06-11 NOTE — Progress Notes (Signed)
Subjective:    Patient ID: Anne Cox, female    DOB: Mar 10, 1950, 69 y.o.   MRN: [id number]  HPI Pt returns for f/u of diabetes mellitus: DM type: Insulin-requiring type 2. 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  GDM: 1972 DKA: never Severe hypoglycemia: never Pancreatitis: never Pancreatic imaging: never SDOH: she cannot afford brand name meds Other: she takes multiple daily injections Interval history: pt states she feels well in general.  she brings a record of her fasting cbg's which I have reviewed today.   cbg's vary from 79-201.  No recent steroids.  Pt says she seldom misses insulin doses Past Medical History:  Diagnosis Date  . Arthritis   . Asthma   . Cerebral aneurysm    1984   RESOLVED  . Diabetes mellitus without complication (HCC)   . Dysrhythmia    IRREG HEARTBEAT  . GERD (gastroesophageal reflux disease)   . Hypertension   . Stroke [facility])    1980    Past Surgical History:  Procedure Laterality Date  . ABDOMINAL HYSTERECTOMY    . CATARACT EXTRACTION W/ INTRAOCULAR LENS  IMPLANT, BILATERAL    . CESAREAN SECTION    . RETINAL LASER PROCEDURE     BILAT  . 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 Marital status: Married    Spouse name: Not on file  . Number of children: Not on file  . Years of education: Not on file  . Highest education level: Not on file  Occupational History  . Not on file  Tobacco Use  . Smoking status: Never Smoker  . Smokeless tobacco: Never Used  Substance and Sexual Activity  . Alcohol use: No  . Drug use: No  . Sexual activity: Not Currently  Other Topics Concern  . Not on file  Social History Narrative  . Not on file   Social Determinants of Health   Financial Resource Strain:   . Difficulty of  Paying Living Expenses:   Food Insecurity:   . Worried About Programme researcher, broadcasting/film/video in the Last Year:   . Barista in the Last Year:   Transportation Needs:   . Freight forwarder (Medical):   Marland Kitchen Lack of Transportation (Non-Medical):   Physical Activity:   . Days of Exercise per Week:   . Minutes of Exercise per Session:   Stress:   . Feeling of Stress :   Social Connections:   . Frequency of Communication with Friends and Family:   . Frequency of Social Gatherings with Friends and Family:   . Attends Religious Services:   . Active Member of Clubs or Organizations:   . Attends Banker Meetings:   Marland Kitchen Marital Status:   Intimate Partner Violence:   . Fear of Current or Ex-Partner:   . Emotionally Abused:   Marland Kitchen Physically Abused:   . Sexually Abused:     Current Outpatient Medications on File Prior to Visit  Medication Sig Dispense Refill  . amLODipine (NORVASC) 10 MG tablet Take 10 mg by mouth daily.    Marland Kitchen atorvastatin (LIPITOR) 20 MG tablet Take 20 mg by mouth daily.    . cetirizine (ZYRTEC) 10 MG tablet Take 10 mg by mouth daily.    Marland Kitchen enoxaparin (LOVENOX) 40  MG/0.4ML injection Inject 0.4 mLs (40 mg total) into the skin daily. 12 Syringe 0  . esomeprazole (NEXIUM) 40 MG capsule Take 40 mg by mouth daily as needed (for reflux).    . Insulin Pen Needle (BD PEN NEEDLE NANO U/F) 32G X 4 MM MISC 1 each by Other route 3 (three) times daily.    Marland Kitchen losartan-hydrochlorothiazide (HYZAAR) 100-25 MG per tablet Take 1 tablet by mouth daily.    . metFORMIN (GLUCOPHAGE) 500 MG tablet Take 2 tablets (1,000 mg total) by mouth daily with breakfast. 180 tablet 3  . methocarbamol (ROBAXIN) 500 MG tablet Take 1-2 tablets (500-1,000 mg total) by mouth every 6 (six) hours as needed for muscle spasms. 60 tablet 2  . metoprolol succinate (TOPROL-XL) 50 MG 24 hr tablet Take 50 mg by mouth 2 (two) times daily. Take with or immediately following a meal.    . mometasone (NASONEX) 50 MCG/ACT  nasal spray Place 2 sprays into the nose daily.    . Multiple Vitamins-Minerals (MULTIVITAMIN WITH MINERALS) tablet Take 1 tablet by mouth daily.    Marland Kitchen oxyCODONE (OXY IR/ROXICODONE) 5 MG immediate release tablet Take 1-2 tablets (5-10 mg total) by mouth every 4 (four) hours as needed for breakthrough pain. 90 tablet 0  . OxyCODONE (OXYCONTIN) 10 mg T12A 12 hr tablet Take 1 tablet (10 mg total) by mouth every 12 (twelve) hours. 30 tablet 0  . Tavaborole (KERYDIN) 5 % SOLN Apply 1 application topically daily.    Marland Kitchen terbinafine (LAMISIL) 250 MG tablet Take 250 mg by mouth daily.     No current facility-administered medications on file prior to visit.    No Known Allergies  Family History  Problem Relation Age of Onset  . Diabetes Mother   . Diabetes Son     BP (!) 142/80   Pulse 89   Ht 5\' 6"  (1.676 m)   Wt 233 lb (105.7 kg)   SpO2 92%   BMI 37.61 kg/m    Review of Systems She denies hypoglycemia    Objective:   Physical Exam VITAL SIGNS:  See vs page GENERAL: no distress Pulses: dorsalis pedis intact bilat.   MSK: no deformity of the feet CV: trace bilat leg edema Skin:  no ulcer on the feet.  normal color and temp on the feet. Neuro: sensation is intact to touch on the feet  A1c=9.4%  Lab Results  Component Value Date   CREATININE 0.56 06/01/2013   BUN 9 06/01/2013   NA 137 06/01/2013   K 3.7 06/01/2013   CL 98 06/01/2013   CO2 28 06/01/2013       Assessment & Plan:  Insulin-requiring type 2 DM, with PN: she needs increased rx.  we discussed.  She wants to change to qd insulin.   HTN: recheck next time.   Patient Instructions  check your blood sugar twice a day.  vary the time of day when you check, between before the 3 meals, and at bedtime.  also check if you have symptoms of your blood sugar being too high or too low.  please keep a record of the readings and bring it to your next appointment here (or you can bring the meter itself).  You can write it on any  piece of paper.  please call us sooner if your blood sugar goes below 70, or if you have a lot of readings over 200.  Please continue the same metformin, and:  Change both insulins to NPH only, 75  units each morning. On this type of insulin schedule, you should eat meals on a regular schedule.  If a meal is missed or significantly delayed, your blood sugar could go low.  Please have a follow-up appointment in 2 months.

## 2019-08-20 ENCOUNTER — Ambulatory Visit: Payer: Medicare PPO | Admitting: Endocrinology

## 2019-09-03 ENCOUNTER — Ambulatory Visit: Payer: Medicare PPO | Admitting: Endocrinology

## 2019-09-03 ENCOUNTER — Other Ambulatory Visit: Payer: Self-pay

## 2019-09-03 ENCOUNTER — Encounter: Payer: Self-pay | Admitting: Endocrinology

## 2019-09-03 VITALS — BP 130/70 | HR 82 | Ht 66.0 in | Wt 236.8 lb

## 2019-09-03 DIAGNOSIS — Z794 Long term (current) use of insulin: Secondary | ICD-10-CM

## 2019-09-03 DIAGNOSIS — E1159 Type 2 diabetes mellitus with other circulatory complications: Secondary | ICD-10-CM

## 2019-09-03 LAB — POCT GLYCOSYLATED HEMOGLOBIN (HGB A1C): Hemoglobin A1C: 9.5 % — AB (ref 4.0–5.6)

## 2019-09-03 MED ORDER — INSULIN NPH (HUMAN) (ISOPHANE) 100 UNIT/ML ~~LOC~~ SUSP
60.0000 [IU] | Freq: Two times a day (BID) | SUBCUTANEOUS | 11 refills | Status: DC
Start: 2019-09-03 — End: 2019-11-03

## 2019-09-03 NOTE — Progress Notes (Signed)
Subjective:    Patient ID: Anne Cox, female    DOB: 1950/11/14, 69 y.o.   MRN: [id number]  HPI Pt returns for f/u of diabetes mellitus: DM type: Insulin-requiring type 2. 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  GDM: 1972 DKA: never Severe hypoglycemia: never Pancreatitis: never Pancreatic imaging: never SDOH: she cannot afford brand name meds Other: she takes QD insulin, after poor results with multiple daily injections.   Interval history: pt states she feels well in general. no cbg record, but states cbg's are in the 200's.  No recent steroids.  Pt says she never insulin doses.  Last week, she increased to 50 units BID.  She did not have hypoglycemia on that dosage, either.  Past Medical History:  Diagnosis Date  . Arthritis   . Asthma   . Cerebral aneurysm    1984   RESOLVED  . Diabetes mellitus without complication (HCC)   . Dysrhythmia    IRREG HEARTBEAT  . GERD (gastroesophageal reflux disease)   . Hypertension   . Stroke [facility])    1980    Past Surgical History:  Procedure Laterality Date  . ABDOMINAL HYSTERECTOMY    . CATARACT EXTRACTION W/ INTRAOCULAR LENS  IMPLANT, BILATERAL    . CESAREAN SECTION    . RETINAL LASER PROCEDURE     BILAT  . 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 Marital status: Married    Spouse name: Not on file  . Number of children: Not on file  . Years of education: Not on file  . Highest education level: Not on file  Occupational History  . Not on file  Tobacco Use  . Smoking status: Never Smoker  . Smokeless tobacco: Never Used  Substance and Sexual Activity  . Alcohol use: No  . Drug use: No  . Sexual activity: Not Currently  Other Topics Concern  . Not on file  Social History Narrative  . Not on file    Social Determinants of Health   Financial Resource Strain:   . Difficulty of Paying Living Expenses: Not on file  Food Insecurity:   . Worried About Programme researcher, broadcasting/film/video in the Last Year: Not on file  . Ran Out of Food in the Last Year: Not on file  Transportation Needs:   . Lack of Transportation (Medical): Not on file  . Lack of Transportation (Non-Medical): Not on file  Physical Activity:   . Days of Exercise per Week: Not on file  . Minutes of Exercise per Session: Not on file  Stress:   . Feeling of Stress : Not on file  Social Connections:   . Frequency of Communication with Friends and Family: Not on file  . Frequency of Social Gatherings with Friends and Family: Not on file  . Attends Religious Services: Not on file  . Active Member of Clubs or Organizations: Not on file  . Attends Banker Meetings: Not on file  . Marital Status: Not on file  Intimate Partner Violence:   . Fear of Current or Ex-Partner: Not on file  . Emotionally Abused: Not on file  . Physically Abused: Not on file  . Sexually Abused: Not on file    Current Outpatient Medications on File Prior to Visit  Medication  Sig Dispense Refill  . amLODipine (NORVASC) 10 MG tablet Take 10 mg by mouth daily.    Marland Kitchen atorvastatin (LIPITOR) 20 MG tablet Take 20 mg by mouth daily.    . cetirizine (ZYRTEC) 10 MG tablet Take 10 mg by mouth daily.    Marland Kitchen enoxaparin (LOVENOX) 40 MG/0.4ML injection Inject 0.4 mLs (40 mg total) into the skin daily. 12 Syringe 0  . esomeprazole (NEXIUM) 40 MG capsule Take 40 mg by mouth daily as needed (for reflux).    . Insulin Pen Needle (BD PEN NEEDLE NANO U/F) 32G X 4 MM MISC 1 each by Other route 2 (two) times daily. E11.9    . losartan-hydrochlorothiazide (HYZAAR) 100-25 MG per tablet Take 1 tablet by mouth daily.    . metFORMIN (GLUCOPHAGE) 500 MG tablet Take 2 tablets (1,000 mg total) by mouth daily with breakfast. 180 tablet 3  . methocarbamol (ROBAXIN) 500 MG tablet  Take 1-2 tablets (500-1,000 mg total) by mouth every 6 (six) hours as needed for muscle spasms. 60 tablet 2  . metoprolol succinate (TOPROL-XL) 50 MG 24 hr tablet Take 50 mg by mouth 2 (two) times daily. Take with or immediately following a meal.    . mometasone (NASONEX) 50 MCG/ACT nasal spray Place 2 sprays into the nose daily.    . Multiple Vitamins-Minerals (MULTIVITAMIN WITH MINERALS) tablet Take 1 tablet by mouth daily.    Marland Kitchen oxyCODONE (OXY IR/ROXICODONE) 5 MG immediate release tablet Take 1-2 tablets (5-10 mg total) by mouth every 4 (four) hours as needed for breakthrough pain. 90 tablet 0  . OxyCODONE (OXYCONTIN) 10 mg T12A 12 hr tablet Take 1 tablet (10 mg total) by mouth every 12 (twelve) hours. 30 tablet 0  . Tavaborole (KERYDIN) 5 % SOLN Apply 1 application topically daily.    Marland Kitchen terbinafine (LAMISIL) 250 MG tablet Take 250 mg by mouth daily.     No current facility-administered medications on file prior to visit.    No Known Allergies  Family History  Problem Relation Age of Onset  . Diabetes Mother   . Diabetes Son     BP 130/70   Pulse 82   Ht 5\' 6"  (1.676 m)   Wt 236 lb 12.8 oz (107.4 kg)   SpO2 91%   BMI 38.22 kg/m    Review of Systems She denies hypoglycemia.     Objective:   Physical Exam VITAL SIGNS:  See vs page GENERAL: no distress Pulses: dorsalis pedis intact bilat.   MSK: no deformity of the feet CV: 1+ bilat leg edema Skin:  no ulcer on the feet.  normal color and temp on the feet. Neuro: sensation is intact to touch on the feet EXT: left great toenail is absent   Lab Results  Component Value Date   HGBA1C 9.5 (A) 09/03/2019   Lab Results  Component Value Date   CREATININE 0.56 06/01/2013   BUN 9 06/01/2013   NA 137 06/01/2013   K 3.7 06/01/2013   CL 98 06/01/2013   CO2 28 06/01/2013       Assessment & Plan:  Type 2 DM, with CVA: uncontrolled. She wants to stay with BID for now.  I told her the pattern of cbg will determine how much  insulin to take at what time of day.    Patient Instructions  check your blood sugar twice a day.  vary the time of day when you check, between before the 3 meals, and at bedtime.  also check if  you have symptoms of your blood sugar being too high or too low.  please keep a record of the readings and bring it to your next appointment here (or you can bring the meter itself).  You can write it on any piece of paper.  please call us sooner if your blood sugar goes below 70, or if you have a lot of readings over 200.  Please continue the same metformin, and:  Increase the NPH insulin to 60 units twice a day.  On this type of insulin schedule, you should eat meals on a regular schedule.  If a meal is missed or significantly delayed, your blood sugar could go low.  Please have a follow-up appointment in 2 months.

## 2019-09-03 NOTE — Patient Instructions (Addendum)
check your blood sugar twice a day.  vary the time of day when you check, between before the 3 meals, and at bedtime.  also check if you have symptoms of your blood sugar being too high or too low.  please keep a record of the readings and bring it to your next appointment here (or you can bring the meter itself).  You can write it on any piece of paper.  please call us sooner if your blood sugar goes below 70, or if you have a lot of readings over 200.  Please continue the same metformin, and:  Increase the NPH insulin to 60 units twice a day.  On this type of insulin schedule, you should eat meals on a regular schedule.  If a meal is missed or significantly delayed, your blood sugar could go low.  Please have a follow-up appointment in 2 months.

## 2019-11-03 ENCOUNTER — Other Ambulatory Visit: Payer: Self-pay

## 2019-11-03 ENCOUNTER — Ambulatory Visit: Payer: Medicare PPO | Admitting: Endocrinology

## 2019-11-03 ENCOUNTER — Encounter: Payer: Self-pay | Admitting: Endocrinology

## 2019-11-03 VITALS — BP 128/84 | HR 82 | Ht 66.0 in | Wt 247.0 lb

## 2019-11-03 DIAGNOSIS — Z794 Long term (current) use of insulin: Secondary | ICD-10-CM | POA: Diagnosis not present

## 2019-11-03 DIAGNOSIS — E1159 Type 2 diabetes mellitus with other circulatory complications: Secondary | ICD-10-CM | POA: Diagnosis not present

## 2019-11-03 LAB — POCT GLYCOSYLATED HEMOGLOBIN (HGB A1C): Hemoglobin A1C: 8.4 % — AB (ref 4.0–5.6)

## 2019-11-03 MED ORDER — INSULIN NPH (HUMAN) (ISOPHANE) 100 UNIT/ML ~~LOC~~ SUSP: 65.0000 [IU] | Freq: Two times a day (BID) | SUBCUTANEOUS | 5 refills | Status: AC

## 2019-11-03 MED ORDER — GLUCOSE BLOOD VI STRP: ORAL_STRIP | 12 refills | Status: DC

## 2019-11-03 NOTE — Patient Instructions (Addendum)
check your blood sugar twice a day.  vary the time of day when you check, between before the 3 meals, and at bedtime.  also check if you have symptoms of your blood sugar being too high or too low.  please keep a record of the readings and bring it to your next appointment here (or you can bring the meter itself).  You can write it on any piece of paper.  please call us sooner if your blood sugar goes below 70, or if you have a lot of readings over 200.  Please continue the same metformin, and:  Increase the NPH insulin to 65 units twice a day.  On this type of insulin schedule, you should eat meals on a regular schedule.  If a meal is missed or significantly delayed, your blood sugar could go low.  Please have a follow-up appointment in 3 months.

## 2019-11-03 NOTE — Progress Notes (Signed)
Subjective:    Patient ID: Anne Cox, female    DOB: 1950-11-11, 69 y.o.   MRN: [id number]  HPI Pt returns for f/u of diabetes mellitus: DM type: Insulin-requiring type 2. 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  GDM: 1972 DKA: never Severe hypoglycemia: never Pancreatitis: never Pancreatic imaging: never SDOH: she cannot afford brand name meds Other: she takes BID insulin, after poor results with multiple daily injections.   Interval history: pt states she feels well in general. no cbg record, but states fasting cbg's vary from 172-242.  No recent steroids.  Pt says she never misses insulin doses.    Past Medical History:  Diagnosis Date  . Arthritis   . Asthma   . Cerebral aneurysm    1984   RESOLVED  . Diabetes mellitus without complication (HCC)   . Dysrhythmia    IRREG HEARTBEAT  . GERD (gastroesophageal reflux disease)   . Hypertension   . Stroke [facility])    1980    Past Surgical History:  Procedure Laterality Date  . ABDOMINAL HYSTERECTOMY    . CATARACT EXTRACTION W/ INTRAOCULAR LENS  IMPLANT, BILATERAL    . CESAREAN SECTION    . RETINAL LASER PROCEDURE     BILAT  . 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 Marital status: Married    Spouse name: Not on file  . Number of children: Not on file  . Years of education: Not on file  . Highest education level: Not on file  Occupational History  . Not on file  Tobacco Use  . Smoking status: Never Smoker  . Smokeless tobacco: Never Used  Substance and Sexual Activity  . Alcohol use: No  . Drug use: No  . Sexual activity: Not Currently  Other Topics Concern  . Not on file  Social History Narrative  . Not on file   Social Determinants of Health   Financial Resource Strain:   . Difficulty  of Paying Living Expenses: Not on file  Food Insecurity:   . Worried About Programme researcher, broadcasting/film/video in the Last Year: Not on file  . Ran Out of Food in the Last Year: Not on file  Transportation Needs:   . Lack of Transportation (Medical): Not on file  . Lack of Transportation (Non-Medical): Not on file  Physical Activity:   . Days of Exercise per Week: Not on file  . Minutes of Exercise per Session: Not on file  Stress:   . Feeling of Stress : Not on file  Social Connections:   . Frequency of Communication with Friends and Family: Not on file  . Frequency of Social Gatherings with Friends and Family: Not on file  . Attends Religious Services: Not on file  . Active Member of Clubs or Organizations: Not on file  . Attends Banker Meetings: Not on file  . Marital Status: Not on file  Intimate Partner Violence:   . Fear of Current or Ex-Partner: Not on file  . Emotionally Abused: Not on file  . Physically Abused: Not on file  . Sexually Abused: Not on file    Current Outpatient Medications on File Prior to Visit  Medication Sig Dispense Refill  . amLODipine (NORVASC) 10 MG tablet Take 10 mg by mouth daily.    Marland Kitchen  atorvastatin (LIPITOR) 20 MG tablet Take 20 mg by mouth daily.    . cetirizine (ZYRTEC) 10 MG tablet Take 10 mg by mouth daily.    Marland Kitchen enoxaparin (LOVENOX) 40 MG/0.4ML injection Inject 0.4 mLs (40 mg total) into the skin daily. 12 Syringe 0  . esomeprazole (NEXIUM) 40 MG capsule Take 40 mg by mouth daily as needed (for reflux).    . Insulin Pen Needle (BD PEN NEEDLE NANO U/F) 32G X 4 MM MISC 1 each by Other route 2 (two) times daily. E11.9    . losartan-hydrochlorothiazide (HYZAAR) 100-25 MG per tablet Take 1 tablet by mouth daily.    . metFORMIN (GLUCOPHAGE) 500 MG tablet Take 2 tablets (1,000 mg total) by mouth daily with breakfast. 180 tablet 3  . methocarbamol (ROBAXIN) 500 MG tablet Take 1-2 tablets (500-1,000 mg total) by mouth every 6 (six) hours as needed for  muscle spasms. 60 tablet 2  . metoprolol succinate (TOPROL-XL) 50 MG 24 hr tablet Take 50 mg by mouth 2 (two) times daily. Take with or immediately following a meal.    . mometasone (NASONEX) 50 MCG/ACT nasal spray Place 2 sprays into the nose daily.    . Multiple Vitamins-Minerals (MULTIVITAMIN WITH MINERALS) tablet Take 1 tablet by mouth daily.    Marland Kitchen oxyCODONE (OXY IR/ROXICODONE) 5 MG immediate release tablet Take 1-2 tablets (5-10 mg total) by mouth every 4 (four) hours as needed for breakthrough pain. 90 tablet 0  . OxyCODONE (OXYCONTIN) 10 mg T12A 12 hr tablet Take 1 tablet (10 mg total) by mouth every 12 (twelve) hours. 30 tablet 0  . Tavaborole (KERYDIN) 5 % SOLN Apply 1 application topically daily.    Marland Kitchen terbinafine (LAMISIL) 250 MG tablet Take 250 mg by mouth daily.     No current facility-administered medications on file prior to visit.    No Known Allergies  Family History  Problem Relation Age of Onset  . Diabetes Mother   . Diabetes Son     BP 128/84   Pulse 82   Ht 5\' 6"  (1.676 m)   Wt 247 lb (112 kg)   SpO2 93%   BMI 39.87 kg/m    Review of Systems She denies hypoglycemia    Objective:   Physical Exam VITAL SIGNS:  See vs page GENERAL: no distress Pulses: dorsalis pedis intact bilat.   MSK: no deformity of the feet CV: 2+ bilat leg edema Skin:  no ulcer on the feet.  normal color and temp on the feet. Neuro: sensation is intact to touch on the feet.   EXT: left great toenail is absent.    Lab Results  Component Value Date   HGBA1C 8.4 (A) 11/03/2019       Assessment & Plan:  Insulin-requiring type 2 DM, with CVA: uncontrolled.  Noncompliance with checking cbg at different times of day.  We'll have to increase AM and PM doses.    Patient Instructions  check your blood sugar twice a day.  vary the time of day when you check, between before the 3 meals, and at bedtime.  also check if you have symptoms of your blood sugar being too high or too low.   please keep a record of the readings and bring it to your next appointment here (or you can bring the meter itself).  You can write it on any piece of paper.  please call 11/05/2019 sooner if your blood sugar goes below 70, or if you have a lot of readings  over 200.  Please continue the same metformin, and:  Increase the NPH insulin to 65 units twice a day.  On this type of insulin schedule, you should eat meals on a regular schedule.  If a meal is missed or significantly delayed, your blood sugar could go low.  Please have a follow-up appointment in 3 months.

## 2019-11-10 ENCOUNTER — Other Ambulatory Visit: Payer: Self-pay

## 2019-11-10 MED ORDER — ONETOUCH ULTRA VI STRP: ORAL_STRIP | 12 refills | Status: DC

## 2019-12-02 ENCOUNTER — Other Ambulatory Visit: Payer: Self-pay | Admitting: *Deleted

## 2019-12-02 ENCOUNTER — Telehealth: Payer: Self-pay | Admitting: *Deleted

## 2019-12-02 DIAGNOSIS — Z794 Long term (current) use of insulin: Secondary | ICD-10-CM

## 2019-12-02 DIAGNOSIS — E1159 Type 2 diabetes mellitus with other circulatory complications: Secondary | ICD-10-CM

## 2019-12-02 MED ORDER — GLUCOSE BLOOD VI STRP: ORAL_STRIP | 12 refills | Status: DC

## 2019-12-02 NOTE — Telephone Encounter (Signed)
Patient called back stating she has an accu-chek [facility] - [phone]

## 2019-12-02 NOTE — Telephone Encounter (Signed)
Notified pt OneTouch test strip was-- denied. Pt mention have accu-chek meter . Sent accu-chek test strip to [facility]

## 2019-12-03 ENCOUNTER — Other Ambulatory Visit: Payer: Self-pay | Admitting: *Deleted

## 2019-12-03 DIAGNOSIS — E1159 Type 2 diabetes mellitus with other circulatory complications: Secondary | ICD-10-CM

## 2019-12-03 DIAGNOSIS — Z794 Long term (current) use of insulin: Secondary | ICD-10-CM

## 2019-12-03 MED ORDER — GLUCOSE BLOOD VI STRP: ORAL_STRIP | 12 refills | Status: AC

## 2020-02-01 ENCOUNTER — Telehealth: Payer: Self-pay | Admitting: Endocrinology

## 2020-02-01 NOTE — Telephone Encounter (Signed)
Pt stated ---regarding medication  Rx Novolin N, R ---have been resolved.

## 2020-02-01 NOTE — Telephone Encounter (Signed)
Patient requests to be called at ph# [phone] re: Patient has a quick question

## 2020-02-09 ENCOUNTER — Ambulatory Visit: Payer: Medicare PPO | Admitting: Endocrinology

## 2020-03-14 ENCOUNTER — Ambulatory Visit: Payer: Medicare PPO | Admitting: Endocrinology

## 2020-05-25 ENCOUNTER — Other Ambulatory Visit: Payer: Self-pay | Admitting: Endocrinology

## 2020-06-19 ENCOUNTER — Other Ambulatory Visit: Payer: Self-pay | Admitting: Endocrinology

## 2020-12-14 ENCOUNTER — Telehealth: Payer: Self-pay

## 2020-12-14 ENCOUNTER — Other Ambulatory Visit (HOSPITAL_COMMUNITY): Payer: Self-pay

## 2020-12-14 NOTE — Telephone Encounter (Signed)
Patient Advocate Encounter   Received notification from [facility] that prior authorization for Blood Glucose Test Strips is due for renewal.   PA submitted on 12/14/20  Key#: [id number] Status is pending    [facility] will continue to follow:   [place] Pink, CPhT Patient Advocate [facility] Phone: [phone] Fax:  [phone]

## 2020-12-15 NOTE — Telephone Encounter (Signed)
Received a fax regarding Prior Authorization from [employer] [facility]) for ONE TOUCH ULTRA TEST STRIPS. Authorization has been DENIED because PT DID NOT MEET CRITERIA.  Criteria are: preferred meter and test strips for self-monitoring of blood sugar. Preferred meters and test strips include: meters and test strips marketed by [employer] (for example: Accu-Chek Aviva Plus, Accu-Chek Guide, Accu-Chek Guide Me) or [employer] (for example: TrueMetrix, TrueTrack). This decision was from [employer] Diabetic Test Strips and Meters Pharmacy Coverage Policy.

## 2020-12-18 ENCOUNTER — Other Ambulatory Visit: Payer: Self-pay

## 2020-12-18 DIAGNOSIS — Z794 Long term (current) use of insulin: Secondary | ICD-10-CM

## 2020-12-18 MED ORDER — ACCU-CHEK GUIDE VI STRP: ORAL_STRIP | 12 refills | Status: AC

## 2020-12-18 MED ORDER — ACCU-CHEK SOFTCLIX LANCETS MISC: 12 refills | Status: AC

## 2020-12-18 MED ORDER — ACCU-CHEK GUIDE W/DEVICE KIT: PACK | Status: AC

## 2020-12-18 NOTE — Telephone Encounter (Signed)
I have sent in to the pharmacy the Accu-chek guide meter, test strips, and lancets since One Touch was denied by insurance.
